# Patient Record
Sex: Female | Born: 1980 | Race: White | Hispanic: No | Smoking: Never smoker
Health system: Southern US, Community
[De-identification: ages and names within clinical notes are randomized; demographics above are authoritative.]

## PROBLEM LIST (undated history)

## (undated) DIAGNOSIS — C801 Malignant (primary) neoplasm, unspecified: Secondary | ICD-10-CM

## (undated) DIAGNOSIS — F411 Generalized anxiety disorder: Secondary | ICD-10-CM

## (undated) DIAGNOSIS — J45909 Unspecified asthma, uncomplicated: Secondary | ICD-10-CM

## (undated) DIAGNOSIS — E039 Hypothyroidism, unspecified: Secondary | ICD-10-CM

## (undated) HISTORY — PX: TUBAL LIGATION: SHX77

## (undated) HISTORY — DX: Generalized anxiety disorder: F41.1

## (undated) HISTORY — DX: Hypothyroidism, unspecified: E03.9

## (undated) HISTORY — PX: DILATION AND CURETTAGE OF UTERUS: SHX78

---

## 2020-05-12 LAB — LIPID PANEL
Cholesterol: 132 (ref 0–200)
HDL: 44 (ref 35–70)
LDL Cholesterol: 74
Triglycerides: 69 (ref 40–160)

## 2020-05-12 LAB — TSH: TSH: 7.43 — AB (ref 0.41–5.90)

## 2020-05-12 LAB — BASIC METABOLIC PANEL
BUN: 13 (ref 4–21)
Creatinine: 0.9 (ref 0.5–1.1)

## 2020-05-12 LAB — HEMOGLOBIN A1C: Hemoglobin A1C: 5.1

## 2020-06-07 ENCOUNTER — Other Ambulatory Visit: Payer: Self-pay

## 2020-06-07 ENCOUNTER — Ambulatory Visit: Payer: Medicaid Other | Admitting: "Endocrinology

## 2020-06-07 ENCOUNTER — Encounter: Payer: Self-pay | Admitting: "Endocrinology

## 2020-06-07 VITALS — BP 100/72 | HR 76 | Ht 66.0 in | Wt 141.6 lb

## 2020-06-07 DIAGNOSIS — E042 Nontoxic multinodular goiter: Secondary | ICD-10-CM | POA: Diagnosis not present

## 2020-06-07 DIAGNOSIS — E038 Other specified hypothyroidism: Secondary | ICD-10-CM

## 2020-06-07 DIAGNOSIS — E039 Hypothyroidism, unspecified: Secondary | ICD-10-CM | POA: Insufficient documentation

## 2020-06-07 NOTE — Progress Notes (Signed)
Endocrinology Consult Note                                            06/07/2020, 3:20 PM   Subjective:    Patient ID: Rebecca York, female    DOB: November 21, 1980, PCP Kotturi, Tyler Deis, MD   History reviewed. No pertinent past medical history. Past Surgical History:  Procedure Laterality Date  . TUBAL LIGATION     Social History   Socioeconomic History  . Marital status: Divorced    Spouse name: Not on file  . Number of children: Not on file  . Years of education: Not on file  . Highest education level: Not on file  Occupational History  . Not on file  Tobacco Use  . Smoking status: Never Smoker  . Smokeless tobacco: Not on file  Vaping Use  . Vaping Use: Never used  Substance and Sexual Activity  . Alcohol use: Never  . Drug use: Never  . Sexual activity: Not on file  Other Topics Concern  . Not on file  Social History Narrative  . Not on file   Social Determinants of Health   Financial Resource Strain: Not on file  Food Insecurity: Not on file  Transportation Needs: Not on file  Physical Activity: Not on file  Stress: Not on file  Social Connections: Not on file   Family History  Problem Relation Age of Onset  . Thyroid disease Mother   . Cancer Father    Outpatient Encounter Medications as of 06/07/2020  Medication Sig  . polyethylene glycol powder (GLYCOLAX/MIRALAX) 17 GM/SCOOP powder Take by mouth as needed.  . busPIRone (BUSPAR) 7.5 MG tablet Take 7.5 mg by mouth 3 (three) times daily.  Marland Kitchen dicyclomine (BENTYL) 20 MG tablet Take 20 mg by mouth 2 (two) times daily as needed.   No facility-administered encounter medications on file as of 06/07/2020.   ALLERGIES: No Known Allergies  VACCINATION STATUS:  There is no immunization history on file for this patient.  HPI Rebecca York is 40 y.o. female who presents today with a medical history as above. she is being seen in consultation for multinodular goiter requested by Wilburt Finlay, MD. History  is obtained directly from the patient.  Patient reports that she did have some neck pain last month where she went to ER in [place].  She was offered baseline thyroid ultrasound which revealed multinodular goiter.   She denies dysphagia, shortness of breath, nor voice change.  She does not have any family history of thyroid malignancy.  She has family history of various thyroid dysfunctions.   Her labs showed elevated TSH of 7.4 associated with normal free T4 1.1 consistent with subclinical hypothyroidism.  She is not on any thyroid hormone supplement or antithyroid intervention. -She reports fluctuating body weight, recent loss of 4 pounds.  She is establishing OB/GYN care in the area. She denies palpitations, tremors.   Review of Systems  Constitutional: + Recent loss of 4 pounds,  no fatigue, no subjective hyperthermia, no subjective hypothermia  Eyes: no blurry vision, no xerophthalmia ENT: no sore throat, no nodules palpated in throat, no dysphagia/odynophagia, no hoarseness Cardiovascular: no Chest Pain, no Shortness of Breath, no palpitations, no leg swelling Respiratory: no cough, no shortness of breath Gastrointestinal: no Nausea/Vomiting/Diarhhea Musculoskeletal: no muscle/joint aches Skin: no rashes Neurological: no tremors, no  numbness, no tingling, no dizziness Psychiatric: no depression, no anxiety  Objective:    Vitals with BMI 06/07/2020  Height 5\' 6"   Weight 141 lbs 10 oz  BMI 35.46  Systolic 568  Diastolic 72  Pulse 76    BP 100/72   Pulse 76   Ht 5\' 6"  (1.676 m)   Wt 141 lb 9.6 oz (64.2 kg)   BMI 22.85 kg/m   Wt Readings from Last 3 Encounters:  06/07/20 141 lb 9.6 oz (64.2 kg)    Physical Exam  Constitutional:  Body mass index is 22.85 kg/m.,  not in acute distress, normal state of mind Eyes: PERRLA, EOMI, no exophthalmos ENT: moist mucous membranes, + palpable nodular thyroid  enlarged.   Cardiovascular: normal precordial activity, Regular Rate and  Rhythm, no Murmur/Rubs/Gallops Respiratory:  adequate breathing efforts, no gross chest deformity, Clear to auscultation bilaterally Gastrointestinal: abdomen soft, Non -tender, No distension, Bowel Sounds present, no gross organomegaly Musculoskeletal: no gross deformities, strength intact in all four extremities Skin: moist, warm, no rashes Neurological: no tremor with outstretched hands, Deep tendon reflexes normal in bilateral lower extremities.  CMP ( most recent) CMP     Component Value Date/Time   BUN 13 05/12/2020 0000   CREATININE 0.9 05/12/2020 0000     Diabetic Labs (most recent): Lab Results  Component Value Date   HGBA1C 5.1 05/12/2020     Lipid Panel ( most recent) Lipid Panel     Component Value Date/Time   CHOL 132 05/12/2020 0000   TRIG 69 05/12/2020 0000   HDL 44 05/12/2020 0000   LDLCALC 74 05/12/2020 0000      Lab Results  Component Value Date   TSH 7.43 (A) 05/12/2020    June 02, 2020 thyroid ultrasound: Right lobe 5.2 cm with 2 nodules measuring 1 cm each.  Left lobe measured 4.7 cm with 1 nodule measuring 1.5 cm.  The left-sided nodule is felt to be suspicious and needs fine-needle aspiration biopsy.      Assessment & Plan:   1. Multinodular goiter   2- subclinical hypothyroidism  - Rebecca York  is being seen at a kind request of Kotturi, Tyler Deis, MD. - I have reviewed her available thyroid records and clinically evaluated the patient. - Based on these reviews, she has subclinical hypothyroidism with elevated TSH and multinodular goiter with 1.5 cm suspicious nodule in the left lobe. -  there is not sufficient information to proceed with definitive treatment plan. -She will need fine-needle aspiration biopsy of the left lobe nodule.  This procedure will be performed in the same facility where she had her thyroid ultrasound at [facility]. She will return with her biopsy results in 2 to 3 weeks. -Further action will depend her biopsy  results.  Regarding her subclinical hypothyroidism, she will not need intervention at this time.  If her biopsy results are negative, she will need repeat thyroid function test in 3 months. She has unexplained fluctuating body weight more recently loss of 4 pounds.  She is advised to update her screening studies including Pap smear.  She is scheduled to see her OB/GYN provider in the near future.      - I did not initiate any new prescriptions today. - she is advised to maintain close follow up with Kotturi, Tyler Deis, MD for primary care needs.   - Time spent with the patient: 60 minutes, of which >50% was spent in  counseling her about her multinodular goiter, subclinical hypothyroidism  and the rest in obtaining information about her symptoms, reviewing her previous labs/studies ( including abstractions from other facilities),  evaluations, and treatments,  and developing a plan to confirm diagnosis and long term treatment based on the latest standards of care/guidelines; and documenting her care.  Celsey Flanagan participated in the discussions, expressed understanding, and voiced agreement with the above plans.  All questions were answered to her satisfaction. she is encouraged to contact clinic should she have any questions or concerns prior to her return visit.  Follow up plan: Return in about 3 weeks (around 06/28/2020) for F/U with Biopsy Results.   Glade Lloyd, MD [facility] [address] [place], [place] [address] Phone: [phone]  Fax: [phone]     06/07/2020, 3:20 PM  This note was partially dictated with voice recognition software. Similar sounding words can be transcribed inadequately or may not  be corrected upon review.

## 2020-06-21 ENCOUNTER — Ambulatory Visit (HOSPITAL_COMMUNITY)
Admission: RE | Admit: 2020-06-21 | Discharge: 2020-06-21 | Disposition: A | Discharge status: Home / Self Care | Payer: Medicaid Other | Source: Ambulatory Visit | Attending: "Endocrinology | Admitting: "Endocrinology

## 2020-06-21 ENCOUNTER — Encounter (HOSPITAL_COMMUNITY): Payer: Self-pay

## 2020-06-21 ENCOUNTER — Other Ambulatory Visit: Payer: Self-pay

## 2020-06-21 DIAGNOSIS — D440 Neoplasm of uncertain behavior of thyroid gland: Secondary | ICD-10-CM | POA: Insufficient documentation

## 2020-06-21 DIAGNOSIS — E042 Nontoxic multinodular goiter: Secondary | ICD-10-CM

## 2020-06-21 DIAGNOSIS — E041 Nontoxic single thyroid nodule: Secondary | ICD-10-CM | POA: Diagnosis present

## 2020-06-21 MED ORDER — LIDOCAINE HCL (PF) 2 % IJ SOLN
10.0000 mL | Freq: Once | INTRAMUSCULAR | Status: AC
  Administered 2020-06-21: 10 mL

## 2020-06-21 NOTE — Sedation Documentation (Signed)
PT tolerated thyroid biopsy procedure well today. Labs obtained and sent for pathology. PT ambulatory at discharge with no acute distress noted and verbalized understanding of discharge instructions.

## 2020-06-21 NOTE — Discharge Instructions (Signed)
Post Operative Instructions for Thyroid Biopsy  1. Keep pressure bandage over site of biopsy for 3-4 hours after leaving office.  2. Normal activity is permitted as long as it does not involve anything strenuous (i.e., jogging, heavy lifting, etc.).  3. Some minor pain may be experienced when the local anesthesia wears off, and should be relieved with Tylenol, Advil, or ibuprofen.  4. You may experience some bruising and/or swelling at the site of the biopsy.  5. If you should develop severe pain, difficulty swallowing or difficulty breathing, please call the office or [facility] at [phone].  6. Apply ice pack for 20 minutes this evening.      

## 2020-06-22 LAB — CYTOLOGY - NON PAP

## 2020-06-23 ENCOUNTER — Encounter: Payer: Self-pay | Admitting: "Endocrinology

## 2020-06-23 ENCOUNTER — Other Ambulatory Visit: Payer: Self-pay

## 2020-06-23 ENCOUNTER — Ambulatory Visit (INDEPENDENT_AMBULATORY_CARE_PROVIDER_SITE_OTHER): Payer: Medicaid Other | Admitting: "Endocrinology

## 2020-06-23 VITALS — BP 96/70 | HR 60 | Ht 66.0 in | Wt 142.2 lb

## 2020-06-23 DIAGNOSIS — E042 Nontoxic multinodular goiter: Secondary | ICD-10-CM | POA: Diagnosis not present

## 2020-06-23 DIAGNOSIS — C73 Malignant neoplasm of thyroid gland: Secondary | ICD-10-CM

## 2020-06-23 DIAGNOSIS — R899 Unspecified abnormal finding in specimens from other organs, systems and tissues: Secondary | ICD-10-CM | POA: Insufficient documentation

## 2020-06-23 NOTE — Progress Notes (Signed)
06/23/2020, 5:10 PM  Endocrinology follow-up note   Subjective:    Patient ID: Rebecca York, female    DOB: [birth year], PCP Kotturi, Tyler Deis, MD   History reviewed. No pertinent past medical history. Past Surgical History:  Procedure Laterality Date  . TUBAL LIGATION     Social History   Socioeconomic History  . Marital status: Divorced    Spouse name: Not on file  . Number of children: Not on file  . Years of education: Not on file  . Highest education level: Not on file  Occupational History  . Not on file  Tobacco Use  . Smoking status: Never Smoker  . Smokeless tobacco: Never Used  Vaping Use  . Vaping Use: Never used  Substance and Sexual Activity  . Alcohol use: Never  . Drug use: Never  . Sexual activity: Not on file  Other Topics Concern  . Not on file  Social History Narrative  . Not on file   Social Determinants of Health   Financial Resource Strain: Not on file  Food Insecurity: Not on file  Transportation Needs: Not on file  Physical Activity: Not on file  Stress: Not on file  Social Connections: Not on file   Family History  Problem Relation Age of Onset  . Thyroid disease Mother   . Cancer Father    Outpatient Encounter Medications as of 06/23/2020  Medication Sig  . busPIRone (BUSPAR) 7.5 MG tablet Take 7.5 mg by mouth 3 (three) times daily.  Marland Kitchen dicyclomine (BENTYL) 20 MG tablet Take 20 mg by mouth 2 (two) times daily as needed.  . polyethylene glycol powder (GLYCOLAX/MIRALAX) 17 GM/SCOOP powder Take by mouth as needed.   No facility-administered encounter medications on file as of 06/23/2020.   ALLERGIES: No Known Allergies  VACCINATION STATUS:  There is no immunization history on file for this patient.  HPI Rebecca York is 40 y.o. female who returns to follow-up after recent fine-needle aspiration biopsy of a thyroid nodule.   She was referred for consult on multinodular goiter requested  by by her PMD: Kotturi, Tyler Deis, MD. History is obtained directly from the patient.  Patient reports that she did have some neck pain last month where she went to ER in [place].  She was offered baseline thyroid ultrasound which revealed multinodular goiter.   She denies dysphagia, shortness of breath, nor voice change.  She does not have any family history of thyroid malignancy.  She has family history of various thyroid dysfunctions.   Her labs showed elevated TSH of 7.4 associated with normal free T4 1.1 consistent with subclinical hypothyroidism.  She is not on any thyroid hormone supplement or antithyroid intervention.  Her fine-needle aspiration biopsy on Jun 21, 2020 revealed category 5 cytology from left lobe nodule- suspicious for malignancy. -She reports fluctuating body weight, recent loss of 4 pounds.  She is establishing OB/GYN care in the area. She denies palpitations, tremors.   Review of Systems  Constitutional: + Minimally fluctuating body weight,   no fatigue, no subjective hyperthermia, no subjective hypothermia    Objective:    Vitals with BMI 06/23/2020 06/21/2020 06/07/2020  Height 5\' 6"  - 5\' 6"   Weight 142 lbs 3 oz - 141 lbs  10 oz  BMI 35.32 - 99.24  Systolic 96 268 341  Diastolic 70 62 72  Pulse 60 76 76    BP 96/70   Pulse 60   Ht 5\' 6"  (1.676 m)   Wt 142 lb 3.2 oz (64.5 kg)   BMI 22.95 kg/m   Wt Readings from Last 3 Encounters:  06/23/20 142 lb 3.2 oz (64.5 kg)  06/07/20 141 lb 9.6 oz (64.2 kg)    Physical Exam  Constitutional:  Body mass index is 22.95 kg/m.,  not in acute distress, normal state of mind Eyes: PERRLA, EOMI, no exophthalmos ENT: moist mucous membranes, + palpable nodular thyroid  enlarged.     CMP ( most recent) CMP     Component Value Date/Time   BUN 13 05/12/2020 0000   CREATININE 0.9 05/12/2020 0000     Diabetic Labs (most recent): Lab Results  Component Value Date   HGBA1C 5.1 05/12/2020     Lipid Panel ( most  recent) Lipid Panel     Component Value Date/Time   CHOL 132 05/12/2020 0000   TRIG 69 05/12/2020 0000   HDL 44 05/12/2020 0000   LDLCALC 74 05/12/2020 0000      Lab Results  Component Value Date   TSH 7.43 (A) 05/12/2020    June 02, 2020 thyroid ultrasound: Right lobe 5.2 cm with 2 nodules measuring 1 cm each.  Left lobe measured 4.7 cm with 1 nodule measuring 1.5 cm.  The left-sided nodule is felt to be suspicious and needs fine-needle aspiration biopsy.      Clinical History: 1.5 cm LUL  Specimen Submitted: A. THYROID GLAND, LEFT LOBE, FINE NEEDLE  ASPIRATION:  FINAL MICROSCOPIC DIAGNOSIS:  - Suspicious for malignancy (Bethesda category V)   SPECIMEN ADEQUACY:  Satisfactory for evaluation   DIAGNOSTIC COMMENTS:  Suspicious for papillary carcinoma  Afirma testing is available upon clinician request.   Assessment & Plan:   1.  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  This is from the 1.5 cm suspicious looking nodule in the left lobe of her thyroid.  She has nodules on the right lobe as well.  Based on category 5 cytology, which showed frank malignancy, she did not need molecular study. Options of approach discussed with her.  The best option would be total thyroidectomy by experienced thyroid surgeon. I would refer her to Dr. Armandina Gemma in [place]. She will return to clinic after her surgery with new set of thyroid function test. She is made aware of the fact that she would be on thyroid hormone for life subsequent to her surgery. Based on the findings of her surgical pathology, she may be considered for radioactive iodine thyroid remnant ablation. -Elevated TSH of 7.43 would have required thyroid hormone supplement, however we will delay this intervention until after her surgery. She has  unexplained fluctuating body weight more recently loss of 4 pounds.  She is advised to update her screening studies including Pap smear.  She is scheduled to see her OB/GYN provider in the near future.    - I did not initiate any new prescriptions today. - she is advised to maintain close follow up with Kotturi, Tyler Deis, MD for primary care needs.   I spent 31 minutes in the care of the patient today including review of   labs from Thyroid Function, imaging/biopsy results,  and face-to-face time discussing with the patient on plan of intervention based on the latest standards of care / guidelines;   and documenting the encounter.  Rebecca York  participated in the discussions, expressed understanding, and voiced agreement with the above plans.  All questions were answered to her satisfaction. she is encouraged to contact clinic should she have any questions or concerns prior to her return visit.   Follow up plan: Return in about 5 weeks (around 07/28/2020) for F/U with Labs after Surgery.   Glade Lloyd, MD [facility] [address] [place], [place] [address] Phone: [phone]  Fax: [phone]     06/23/2020, 5:10 PM  This note was partially dictated with voice recognition software. Similar sounding words can be transcribed inadequately or may not  be corrected upon review.

## 2020-06-29 ENCOUNTER — Ambulatory Visit: Payer: Medicaid Other | Admitting: "Endocrinology

## 2020-07-11 ENCOUNTER — Ambulatory Visit: Payer: Medicaid Other | Admitting: "Endocrinology

## 2020-07-25 ENCOUNTER — Ambulatory Visit: Payer: Medicaid Other | Admitting: "Endocrinology

## 2020-07-26 ENCOUNTER — Ambulatory Visit: Payer: Self-pay | Admitting: Surgery

## 2020-07-31 ENCOUNTER — Ambulatory Visit (HOSPITAL_COMMUNITY)
Admission: RE | Admit: 2020-07-31 | Discharge: 2020-07-31 | Disposition: A | Discharge status: Home / Self Care | Payer: Medicaid Other | Source: Ambulatory Visit | Attending: Anesthesiology | Admitting: Anesthesiology

## 2020-07-31 ENCOUNTER — Other Ambulatory Visit: Payer: Self-pay

## 2020-07-31 ENCOUNTER — Ambulatory Visit: Payer: Medicaid Other | Admitting: "Endocrinology

## 2020-07-31 ENCOUNTER — Encounter (HOSPITAL_COMMUNITY)
Admission: RE | Admit: 2020-07-31 | Discharge: 2020-07-31 | Disposition: A | Discharge status: Home / Self Care | Payer: Medicaid Other | Source: Ambulatory Visit | Attending: Surgery | Admitting: Surgery

## 2020-07-31 ENCOUNTER — Encounter (HOSPITAL_COMMUNITY): Payer: Self-pay

## 2020-07-31 DIAGNOSIS — D497 Neoplasm of unspecified behavior of endocrine glands and other parts of nervous system: Secondary | ICD-10-CM | POA: Insufficient documentation

## 2020-07-31 HISTORY — DX: Unspecified asthma, uncomplicated: J45.909

## 2020-07-31 LAB — CBC
HCT: 40 % (ref 36.0–46.0)
Hemoglobin: 13.3 g/dL (ref 12.0–15.0)
MCH: 27.9 pg (ref 26.0–34.0)
MCHC: 33.3 g/dL (ref 30.0–36.0)
MCV: 83.9 fL (ref 80.0–100.0)
Platelets: 194 10*3/uL (ref 150–400)
RBC: 4.77 MIL/uL (ref 3.87–5.11)
RDW: 12.9 % (ref 11.5–15.5)
WBC: 6.7 10*3/uL (ref 4.0–10.5)
nRBC: 0 % (ref 0.0–0.2)

## 2020-07-31 LAB — BASIC METABOLIC PANEL
Anion gap: 7 (ref 5–15)
BUN: 15 mg/dL (ref 6–20)
CO2: 25 mmol/L (ref 22–32)
Calcium: 9.1 mg/dL (ref 8.9–10.3)
Chloride: 107 mmol/L (ref 98–111)
Creatinine, Ser: 0.78 mg/dL (ref 0.44–1.00)
GFR, Estimated: >60 mL/min (ref >60)
Glucose, Bld: 93 mg/dL (ref 70–99)
Potassium: 4 mmol/L (ref 3.5–5.1)
Sodium: 139 mmol/L (ref 135–145)

## 2020-07-31 LAB — HCG, SERUM, QUALITATIVE: Preg, Serum: NEGATIVE

## 2020-07-31 NOTE — Progress Notes (Signed)
   PCP - Dr Maeola Sarah    Chest x-ray - epic 07/31/2020 EKG - epic 07/31/2020  B Activity level:  Can go up a flight of stairs and activities of daily living without stopping and without symptoms    Anesthesia review:   Patient denies shortness of breath, fever, cough and chest pain at PAT appointment   Patient verbalized understanding of instructions that were given to them at the PAT appointment. Patient was also instructed that they will need to review over the PAT instructions again at home before surgery.

## 2020-07-31 NOTE — Patient Instructions (Addendum)
DUE TO COVID-19 ONLY ONE VISITOR IS ALLOWED TO COME WITH YOU AND STAY IN THE WAITING ROOM ONLY DURING PRE OP AND PROCEDURE DAY OF SURGERY. THE 1 VISITOR  MAY VISIT WITH YOU AFTER SURGERY IN YOUR PRIVATE ROOM DURING VISITING HOURS ONLY!  YOU NEED TO HAVE A COVID 19 TEST ON Monday August 07, 2020 @ 2:30 pm, THIS TEST MUST BE DONE BEFORE SURGERY,  COVID TESTING SITE [place] [id number], IT IS ON THE RIGHT GOING OUT [address] APPROXIMATELY  2 MINUTES PAST [employer] ON THE RIGHT. ONCE YOUR COVID TEST IS COMPLETED,  PLEASE BEGIN THE QUARANTINE INSTRUCTIONS AS OUTLINED IN YOUR HANDOUT.                Rebecca York  07/31/2020   Your procedure is scheduled on: Thursday August 10, 2020   Report to [facility] Main  Entrance   Report to admitting at 0800 AM     Call this number if you have problems the morning of surgery [phone]    Remember: Do not eat food after Midnight; clear liquids from midnight till 0700 consuming entire ensure presurgery drink bu 0700 then nothing by mouth.    BRUSH YOUR TEETH MORNING OF SURGERY AND RINSE YOUR MOUTH OUT, NO CHEWING GUM CANDY OR MINTS.     Take these medicines the morning of surgery with A SIP OF WATER: NONE                                You may not have any metal on your body including hair pins and              piercings  Do not wear jewelry, make-up, lotions, powders or perfumes, deodorant             Do not wear nail polish on your fingernails.  Do not shave  48 hours prior to surgery.          Do not bring valuables to the hospital. [facility].  Contacts, dentures or bridgework may not be worn into surgery.  Leave suitcase in the car. After surgery it may be brought to your room.     Patients discharged the day of surgery will not be allowed to drive home. IF YOU ARE HAVING SURGERY AND GOING HOME THE SAME DAY, YOU MUST HAVE AN ADULT TO DRIVE YOU HOME  AND BE WITH YOU FOR 24 HOURS. YOU MAY GO HOME BY TAXI OR UBER OR ORTHERWISE, BUT AN ADULT MUST ACCOMPANY YOU HOME AND STAY WITH YOU FOR 24 HOURS.  _____________________________________________________________________                CLEAR LIQUID DIET   Foods Allowed                                                                     Foods Excluded  Coffee and tea, regular and decaf  liquids that you cannot  Plain Jell-O any favor except red or purple                                           see through such as: Fruit ices (not with fruit pulp)                                     milk, soups, orange juice  Iced Popsicles                                    All solid food Carbonated beverages, regular and diet                                    Cranberry, grape and apple juices Sports drinks like Gatorade Lightly seasoned clear broth or consume(fat free) Sugar, honey syrup  Sample Menu Breakfast                                Lunch                                     Supper Cranberry juice                    Beef broth                            Chicken broth Jell-O                                     Grape juice                           Apple juice Coffee or tea                        Jell-O                                      Popsicle                                                Coffee or tea                        Coffee or tea  _____________________________________________________________________  [facility] - Preparing for Surgery Before surgery, you can play an important role.  Because skin is not sterile, your skin needs to be as free of germs as possible.  You can reduce the number of germs on your skin by washing with CHG (chlorahexidine gluconate) soap before surgery.  CHG is an antiseptic cleaner which kills  germs and bonds with the skin to continue killing germs even after washing. Please DO NOT use if you have an allergy to CHG or  antibacterial soaps.  If your skin becomes reddened/irritated stop using the CHG and inform your nurse when you arrive at Short Stay. Do not shave (including legs and underarms) for at least 48 hours prior to the first CHG shower.  You may shave your face/neck. Please follow these instructions carefully:  1.  Shower with CHG Soap the night before surgery and the  morning of Surgery.  2.  If you choose to wash your hair, wash your hair first as usual with your  normal  shampoo.  3.  After you shampoo, rinse your hair and body thoroughly to remove the  shampoo.                           4.  Use CHG as you would any other liquid soap.  You can apply chg directly  to the skin and wash                       Gently with a scrungie or clean washcloth.  5.  Apply the CHG Soap to your body ONLY FROM THE NECK DOWN.   Do not use on face/ open                           Wound or open sores. Avoid contact with eyes, ears mouth and genitals (private parts).                       Wash face,  Genitals (private parts) with your normal soap.             6.  Wash thoroughly, paying special attention to the area where your surgery  will be performed.  7.  Thoroughly rinse your body with warm water from the neck down.  8.  DO NOT shower/wash with your normal soap after using and rinsing off  the CHG Soap.                9.  Pat yourself dry with a clean towel.            10.  Wear clean pajamas.            11.  Place clean sheets on your bed the night of your first shower and do not  sleep with pets. Day of Surgery : Do not apply any lotions/deodorants the morning of surgery.  Please wear clean clothes to the hospital/surgery center.  FAILURE TO FOLLOW THESE INSTRUCTIONS MAY RESULT IN THE CANCELLATION OF YOUR SURGERY PATIENT SIGNATURE_________________________________  NURSE SIGNATURE__________________________________  ________________________________________________________________________

## 2020-08-06 ENCOUNTER — Encounter (HOSPITAL_COMMUNITY): Payer: Self-pay | Admitting: Surgery

## 2020-08-06 DIAGNOSIS — D440 Neoplasm of uncertain behavior of thyroid gland: Secondary | ICD-10-CM | POA: Diagnosis present

## 2020-08-06 NOTE — H&P (Signed)
General Surgery [facility]  Rebecca York DOB: 1980/10/16 Divorced / Language: English / Race: White Female   History of Present Illness   The patient is a 40 year old female who presents with a thyroid nodule.  CHIEF COMPLAINT: thyroid neoplasm suspicious for papillary carcinoma  Patient is referred by Dr. 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  Patient had been noted on routine laboratory studies to have an elevated TSH level of 7.43.  She was evaluated by her primary care physician and an ultrasound was obtained.  The study was performed on June 02, 2020.  It demonstrated to nodules in the right thyroid lobe each measuring 1 cm in size.  There was a 1.5 cm in the superior left thyroid lobe which was suspicious and fine-needle aspiration biopsy was ordered.  Patient underwent biopsy on Jun 21, 2020.  Cytopathology was suspicious for malignancy, Bethesda category V, suspicious for papillary thyroid carcinoma.  Patient has no prior history of thyroid disease.  She has never been on thyroid medication.  Thyroid medication has not yet been started.  She has had no prior head or neck surgery.  There is a family history of thyroid nodules and thyroid goiter in both the patient's mother and her sister.  Patient has noted some voice quality changes recently.  She has also had some discomfort in the left side of the neck.  She works as an Scientist, physiological at an assisted living facility.   Past Surgical History  No pertinent past surgical history    Diagnostic Studies History  Colonoscopy   never Mammogram   never Pap Smear   1-5 years ago  Allergies  No Known Drug Allergies    Allergies Reconciled    Medication History  busPIRone HCl  (7.5MG  Tablet, Oral) Active. MiraLax  (17GM Packet, Oral) Active. Medications Reconciled   Social History  Alcohol use   Occasional alcohol  use. Caffeine use   Carbonated beverages, Coffee. No drug use   Tobacco use   Never smoker.  Family History  Alcohol Abuse   Father, Mother. Cancer   Family Members In General, Father. Cervical Cancer   Sister. Thyroid problems   Mother, Sister.  Pregnancy / Birth History  Age at menarche   10 years. Contraceptive History   Intrauterine device, Oral contraceptives. Gravida   6 Length (months) of breastfeeding   3-6 Maternal age   77-20 Para   4  Other Problems  Asthma    Review of Systems  General Present- Appetite Loss, Chills, Night Sweats and Weight Loss. Not Present- Fatigue, Fever and Weight Gain. Skin Not Present- Change in Wart/Mole, Dryness, Hives, Jaundice, New Lesions, Non-Healing Wounds, Rash and Ulcer. HEENT Present- Hearing Loss and Wears glasses/contact lenses. Not Present- Earache, Hoarseness, Nose Bleed, Oral Ulcers, Ringing in the Ears, Seasonal Allergies, Sinus Pain, Sore Throat, Visual Disturbances and Yellow Eyes. Respiratory Present- Difficulty Breathing. Not Present- Bloody sputum, Chronic Cough, Snoring and Wheezing. Breast Not Present- Breast Mass, Breast Pain, Nipple Discharge and Skin Changes. Cardiovascular Present- Shortness of Breath. Not Present- Chest Pain, Difficulty Breathing Lying Down, Leg Cramps, Palpitations, Rapid Heart Rate and Swelling of Extremities. Gastrointestinal Present- Bloating and Excessive gas. Not Present- Abdominal Pain, Bloody Stool, Change in Bowel Habits, Chronic diarrhea, Constipation, Difficulty Swallowing, Gets full quickly at meals, Hemorrhoids, Indigestion, Nausea, Rectal Pain and Vomiting. Female Genitourinary Not Present- Frequency, Nocturia, Painful Urination, Pelvic Pain and Urgency. Musculoskeletal Present-  Back Pain. Not Present- Joint Pain, Joint Stiffness, Muscle Pain, Muscle Weakness and Swelling of Extremities. Neurological Present- Headaches. Not Present- Decreased Memory, Fainting, Numbness, Seizures, Tingling,  Tremor, Trouble walking and Weakness. Psychiatric Present- Anxiety and Frequent crying. Not Present- Bipolar, Change in Sleep Pattern, Depression and Fearful. Hematology Not Present- Blood Thinners, Easy Bruising, Excessive bleeding, Gland problems, HIV and Persistent Infections.  Vitals  Weight: 141.38 lb   Height: 66 in  Body Surface Area: 1.73 m   Body Mass Index: 22.82 kg/m   Temp.: 98.1 F    Pulse: 84 (Regular)    P.OX: 98% (Room air) BP: 110/80(Sitting, Left Arm, Standard)  Physical Exam   GENERAL APPEARANCE Development: normal Nutritional status: normal Gross deformities: none  SKIN Rash, lesions, ulcers: none Induration, erythema: none Nodules: none palpable  EYES Conjunctiva and lids: normal Pupils: equal and reactive Iris: normal bilaterally  EARS, NOSE, MOUTH, THROAT External ears: no lesion or deformity External nose: no lesion or deformity Hearing: grossly normal Due to Covid-19 pandemic, patient is wearing a mask.  NECK Symmetric: yes Trachea: midline Thyroid: Right thyroid lobe is normal in size and slightly firm to palpation without discrete or dominant nodule. Left thyroid lobe is slightly tender to palpation with what appears to be a small relatively firm nodule in the superior pole. There is no associated lymphadenopathy. There are no supraclavicular masses.  CHEST Respiratory effort: normal Retraction or accessory muscle use: no Breath sounds: normal bilaterally Rales, rhonchi, wheeze: none  CARDIOVASCULAR Auscultation: regular rhythm, normal rate Murmurs: none Pulses: radial pulse 2+ palpable Lower extremity edema: none  MUSCULOSKELETAL Station and gait: normal Digits and nails: no clubbing or cyanosis Muscle strength: grossly normal all extremities Range of motion: grossly normal all extremities Deformity: none  LYMPHATIC Cervical: none palpable Supraclavicular: none palpable  PSYCHIATRIC Oriented to person, place, and time:  yes Mood and affect: normal for situation Judgment and insight: appropriate for situation    Assessment & Plan   NEOPLASM OF UNCERTAIN BEHAVIOR OF THYROID GLAND (D44.0) MULTIPLE THYROID NODULES (E04.2)  Patient is referred by her endocrinologist for surgical evaluation and management of a newly diagnosed thyroid nodule which is suspicious for papillary thyroid carcinoma.   Patient provided with a copy of "The Thyroid Book: Medical and Surgical Treatment of Thyroid Problems", published by [employer], 16 pages.  Book reviewed and explained to patient during visit today.  Patient has bilateral thyroid nodules.  The dominant nodule in the left superior pole is suspicious on fine-needle aspiration cytology for papillary thyroid carcinoma.  Today we discussed the options for surgical management.  We discussed performing a left thyroid lobectomy versus performing a total thyroidectomy.  We discussed the risk and benefits of surgery including the risk of recurrent laryngeal nerve injury and injury to parathyroid glands.  We discussed the fact that she would need to begin taking thyroid hormone and we continue taking that for the rest of her life.  We discussed the size and location of the surgical incision.  We discussed the hospital stay to be anticipated.  We discussed the potential need for radioactive iodine treatment.  The patient understands and wishes to proceed with surgery in the near future.  The risks and benefits of the procedure have been discussed at length with the patient.  The patient understands the proposed procedure, potential alternative treatments, and the course of recovery to be expected.  All of the patient's questions have been answered at this time.  The patient wishes to proceed with  surgery.  Armandina Gemma, MD [facility] Office: [phone]

## 2020-08-07 ENCOUNTER — Other Ambulatory Visit (HOSPITAL_COMMUNITY)
Admission: RE | Admit: 2020-08-07 | Discharge: 2020-08-07 | Disposition: A | Discharge status: Home / Self Care | Payer: Medicaid Other | Source: Ambulatory Visit | Attending: Surgery | Admitting: Surgery

## 2020-08-08 ENCOUNTER — Other Ambulatory Visit (HOSPITAL_COMMUNITY)
Admission: RE | Admit: 2020-08-08 | Discharge: 2020-08-08 | Disposition: A | Discharge status: Home / Self Care | Payer: Medicaid Other | Source: Ambulatory Visit | Attending: Surgery | Admitting: Surgery

## 2020-08-08 DIAGNOSIS — Z20822 Contact with and (suspected) exposure to covid-19: Secondary | ICD-10-CM | POA: Insufficient documentation

## 2020-08-08 DIAGNOSIS — Z01812 Encounter for preprocedural laboratory examination: Secondary | ICD-10-CM | POA: Diagnosis not present

## 2020-08-08 LAB — SARS CORONAVIRUS 2 (TAT 6-24 HRS): SARS Coronavirus 2: NEGATIVE

## 2020-08-10 ENCOUNTER — Ambulatory Visit (HOSPITAL_COMMUNITY): Payer: Medicaid Other | Admitting: Anesthesiology

## 2020-08-10 ENCOUNTER — Encounter (HOSPITAL_COMMUNITY)
Admission: RE | Disposition: A | Discharge status: Home / Self Care | Payer: Self-pay | Source: Other Acute Inpatient Hospital | Attending: Surgery

## 2020-08-10 ENCOUNTER — Encounter (HOSPITAL_COMMUNITY): Payer: Self-pay | Admitting: Surgery

## 2020-08-10 ENCOUNTER — Ambulatory Visit (HOSPITAL_COMMUNITY)
Admission: RE | Admit: 2020-08-10 | Discharge: 2020-08-11 | Disposition: A | Discharge status: Home / Self Care | Payer: Medicaid Other | Source: Other Acute Inpatient Hospital | Attending: Surgery | Admitting: Surgery

## 2020-08-10 ENCOUNTER — Other Ambulatory Visit: Payer: Self-pay

## 2020-08-10 DIAGNOSIS — D440 Neoplasm of uncertain behavior of thyroid gland: Secondary | ICD-10-CM | POA: Diagnosis present

## 2020-08-10 DIAGNOSIS — E063 Autoimmune thyroiditis: Secondary | ICD-10-CM | POA: Diagnosis not present

## 2020-08-10 DIAGNOSIS — C73 Malignant neoplasm of thyroid gland: Secondary | ICD-10-CM | POA: Insufficient documentation

## 2020-08-10 DIAGNOSIS — Z8349 Family history of other endocrine, nutritional and metabolic diseases: Secondary | ICD-10-CM | POA: Diagnosis not present

## 2020-08-10 DIAGNOSIS — D497 Neoplasm of unspecified behavior of endocrine glands and other parts of nervous system: Secondary | ICD-10-CM

## 2020-08-10 DIAGNOSIS — Z79899 Other long term (current) drug therapy: Secondary | ICD-10-CM | POA: Insufficient documentation

## 2020-08-10 DIAGNOSIS — E042 Nontoxic multinodular goiter: Secondary | ICD-10-CM | POA: Diagnosis present

## 2020-08-10 HISTORY — PX: THYROIDECTOMY: SHX17

## 2020-08-10 SURGERY — THYROIDECTOMY
Anesthesia: General | Site: Neck

## 2020-08-10 MED ORDER — ENSURE ENLIVE PO LIQD
237.0000 mL | Freq: Two times a day (BID) | ORAL | Status: DC
  Administered 2020-08-10: 237 mL via ORAL

## 2020-08-10 MED ORDER — LIDOCAINE 2% (20 MG/ML) 5 ML SYRINGE
INTRAMUSCULAR | Status: AC
  Filled 2020-08-10: qty 5

## 2020-08-10 MED ORDER — HYDROMORPHONE HCL 1 MG/ML IJ SOLN
INTRAMUSCULAR | Status: AC
  Administered 2020-08-10: 0.5 mg via INTRAVENOUS
  Filled 2020-08-10: qty 1

## 2020-08-10 MED ORDER — PROPOFOL 10 MG/ML IV BOLUS
INTRAVENOUS | Status: DC | PRN
  Administered 2020-08-10: 130 mg via INTRAVENOUS

## 2020-08-10 MED ORDER — ROCURONIUM BROMIDE 10 MG/ML (PF) SYRINGE
PREFILLED_SYRINGE | INTRAVENOUS | Status: AC
  Filled 2020-08-10: qty 10

## 2020-08-10 MED ORDER — 0.9 % SODIUM CHLORIDE (POUR BTL) OPTIME
TOPICAL | Status: DC | PRN
  Administered 2020-08-10: 1000 mL

## 2020-08-10 MED ORDER — ONDANSETRON HCL 4 MG/2ML IJ SOLN
INTRAMUSCULAR | Status: DC | PRN
  Administered 2020-08-10: 4 mg via INTRAVENOUS

## 2020-08-10 MED ORDER — OXYCODONE HCL 5 MG PO TABS: 5.0000 mg | ORAL_TABLET | ORAL | Status: DC | PRN

## 2020-08-10 MED ORDER — HEMOSTATIC AGENTS (NO CHARGE) OPTIME
TOPICAL | Status: DC | PRN
  Administered 2020-08-10: 1 via TOPICAL

## 2020-08-10 MED ORDER — EPHEDRINE SULFATE-NACL 50-0.9 MG/10ML-% IV SOSY
PREFILLED_SYRINGE | INTRAVENOUS | Status: DC | PRN
  Administered 2020-08-10: 10 mg via INTRAVENOUS

## 2020-08-10 MED ORDER — ORAL CARE MOUTH RINSE: 15.0000 mL | Freq: Once | OROMUCOSAL | Status: AC

## 2020-08-10 MED ORDER — DEXAMETHASONE SODIUM PHOSPHATE 10 MG/ML IJ SOLN
INTRAMUSCULAR | Status: DC | PRN
  Administered 2020-08-10: 10 mg via INTRAVENOUS

## 2020-08-10 MED ORDER — PHENYLEPHRINE 40 MCG/ML (10ML) SYRINGE FOR IV PUSH (FOR BLOOD PRESSURE SUPPORT)
PREFILLED_SYRINGE | INTRAVENOUS | Status: AC
  Filled 2020-08-10: qty 10

## 2020-08-10 MED ORDER — CALCIUM CARBONATE 1250 (500 CA) MG PO TABS
2.0000 | ORAL_TABLET | Freq: Three times a day (TID) | ORAL | Status: DC
  Administered 2020-08-10 – 2020-08-11 (×2): 1000 mg via ORAL
  Filled 2020-08-10 (×2): qty 1

## 2020-08-10 MED ORDER — PHENYLEPHRINE 40 MCG/ML (10ML) SYRINGE FOR IV PUSH (FOR BLOOD PRESSURE SUPPORT)
PREFILLED_SYRINGE | INTRAVENOUS | Status: DC | PRN
  Administered 2020-08-10 (×2): 80 ug via INTRAVENOUS
  Administered 2020-08-10: 120 ug via INTRAVENOUS
  Administered 2020-08-10: 80 ug via INTRAVENOUS

## 2020-08-10 MED ORDER — CEFAZOLIN SODIUM-DEXTROSE 2-4 GM/100ML-% IV SOLN
2.0000 g | INTRAVENOUS | Status: AC
  Administered 2020-08-10: 2 g via INTRAVENOUS
  Filled 2020-08-10: qty 100

## 2020-08-10 MED ORDER — SUGAMMADEX SODIUM 200 MG/2ML IV SOLN
INTRAVENOUS | Status: DC | PRN
  Administered 2020-08-10: 130 mg via INTRAVENOUS

## 2020-08-10 MED ORDER — CHLORHEXIDINE GLUCONATE CLOTH 2 % EX PADS: 6.0000 | MEDICATED_PAD | Freq: Once | CUTANEOUS | Status: DC

## 2020-08-10 MED ORDER — ROCURONIUM BROMIDE 10 MG/ML (PF) SYRINGE
PREFILLED_SYRINGE | INTRAVENOUS | Status: DC | PRN
  Administered 2020-08-10: 10 mg via INTRAVENOUS
  Administered 2020-08-10: 50 mg via INTRAVENOUS

## 2020-08-10 MED ORDER — LACTATED RINGERS IV SOLN: INTRAVENOUS | Status: DC

## 2020-08-10 MED ORDER — HYDROMORPHONE HCL 1 MG/ML IJ SOLN
INTRAMUSCULAR | Status: AC
  Filled 2020-08-10: qty 1

## 2020-08-10 MED ORDER — ACETAMINOPHEN 325 MG PO TABS
650.0000 mg | ORAL_TABLET | Freq: Four times a day (QID) | ORAL | Status: DC | PRN
  Administered 2020-08-10: 650 mg via ORAL
  Filled 2020-08-10: qty 2

## 2020-08-10 MED ORDER — LIDOCAINE 2% (20 MG/ML) 5 ML SYRINGE
INTRAMUSCULAR | Status: DC | PRN
  Administered 2020-08-10: 60 mg via INTRAVENOUS

## 2020-08-10 MED ORDER — ACETAMINOPHEN 650 MG RE SUPP: 650.0000 mg | Freq: Four times a day (QID) | RECTAL | Status: DC | PRN

## 2020-08-10 MED ORDER — MIDAZOLAM HCL 2 MG/2ML IJ SOLN
INTRAMUSCULAR | Status: DC | PRN
  Administered 2020-08-10: 2 mg via INTRAVENOUS

## 2020-08-10 MED ORDER — ONDANSETRON 4 MG PO TBDP: 4.0000 mg | ORAL_TABLET | Freq: Four times a day (QID) | ORAL | Status: DC | PRN

## 2020-08-10 MED ORDER — HYDROMORPHONE HCL 1 MG/ML IJ SOLN
0.2500 mg | INTRAMUSCULAR | Status: DC | PRN
  Administered 2020-08-10 (×2): 0.5 mg via INTRAVENOUS

## 2020-08-10 MED ORDER — TRAMADOL HCL 50 MG PO TABS: 50.0000 mg | ORAL_TABLET | Freq: Four times a day (QID) | ORAL | Status: DC | PRN

## 2020-08-10 MED ORDER — ACETAMINOPHEN 500 MG PO TABS
1000.0000 mg | ORAL_TABLET | Freq: Once | ORAL | Status: AC
  Administered 2020-08-10: 1000 mg via ORAL
  Filled 2020-08-10: qty 2

## 2020-08-10 MED ORDER — FENTANYL CITRATE (PF) 250 MCG/5ML IJ SOLN
INTRAMUSCULAR | Status: AC
  Filled 2020-08-10: qty 5

## 2020-08-10 MED ORDER — SODIUM CHLORIDE 0.45 % IV SOLN: INTRAVENOUS | Status: DC

## 2020-08-10 MED ORDER — FENTANYL CITRATE (PF) 250 MCG/5ML IJ SOLN
INTRAMUSCULAR | Status: DC | PRN
  Administered 2020-08-10: 100 ug via INTRAVENOUS
  Administered 2020-08-10 (×3): 50 ug via INTRAVENOUS

## 2020-08-10 MED ORDER — AMISULPRIDE (ANTIEMETIC) 5 MG/2ML IV SOLN
5.0000 mg | Freq: Once | INTRAVENOUS | Status: AC | PRN
  Administered 2020-08-10: 5 mg via INTRAVENOUS

## 2020-08-10 MED ORDER — AMISULPRIDE (ANTIEMETIC) 5 MG/2ML IV SOLN
INTRAVENOUS | Status: AC
  Filled 2020-08-10: qty 2

## 2020-08-10 MED ORDER — PROPOFOL 10 MG/ML IV BOLUS
INTRAVENOUS | Status: AC
  Filled 2020-08-10: qty 20

## 2020-08-10 MED ORDER — DEXAMETHASONE SODIUM PHOSPHATE 10 MG/ML IJ SOLN
INTRAMUSCULAR | Status: AC
  Filled 2020-08-10: qty 1

## 2020-08-10 MED ORDER — CHLORHEXIDINE GLUCONATE 0.12 % MT SOLN
15.0000 mL | Freq: Once | OROMUCOSAL | Status: AC
  Administered 2020-08-10: 15 mL via OROMUCOSAL

## 2020-08-10 MED ORDER — MIDAZOLAM HCL 2 MG/2ML IJ SOLN
INTRAMUSCULAR | Status: AC
  Filled 2020-08-10: qty 2

## 2020-08-10 MED ORDER — ONDANSETRON HCL 4 MG/2ML IJ SOLN
4.0000 mg | Freq: Four times a day (QID) | INTRAMUSCULAR | Status: DC | PRN
  Administered 2020-08-10: 4 mg via INTRAVENOUS
  Filled 2020-08-10: qty 2

## 2020-08-10 MED ORDER — HYDROMORPHONE HCL 1 MG/ML IJ SOLN
1.0000 mg | INTRAMUSCULAR | Status: DC | PRN
  Administered 2020-08-10: 1 mg via INTRAVENOUS
  Filled 2020-08-10: qty 1

## 2020-08-10 MED ORDER — ONDANSETRON HCL 4 MG/2ML IJ SOLN
INTRAMUSCULAR | Status: AC
  Filled 2020-08-10: qty 2

## 2020-08-10 SURGICAL SUPPLY — 31 items
ATTRACTOMAT 16X20 MAGNETIC DRP (DRAPES) ×3 IMPLANT
BAG COUNTER SPONGE SURGICOUNT (BAG) IMPLANT
BAG SURGICOUNT SPONGE COUNTING (BAG)
BLADE SURG 15 STRL LF DISP TIS (BLADE) ×1 IMPLANT
BLADE SURG 15 STRL SS (BLADE) ×2
CHLORAPREP W/TINT 26 (MISCELLANEOUS) ×3 IMPLANT
CLIP VESOCCLUDE MED 6/CT (CLIP) ×6 IMPLANT
CLIP VESOCCLUDE SM WIDE 6/CT (CLIP) ×12 IMPLANT
COVER SURGICAL LIGHT HANDLE (MISCELLANEOUS) ×3 IMPLANT
DERMABOND ADVANCED (GAUZE/BANDAGES/DRESSINGS) ×2
DERMABOND ADVANCED .7 DNX12 (GAUZE/BANDAGES/DRESSINGS) ×1 IMPLANT
DRAPE LAPAROTOMY T 98X78 PEDS (DRAPES) ×3 IMPLANT
DRAPE UTILITY XL STRL (DRAPES) ×3 IMPLANT
ELECT PENCIL ROCKER SW 15FT (MISCELLANEOUS) ×3 IMPLANT
ELECT REM PT RETURN 15FT ADLT (MISCELLANEOUS) ×3 IMPLANT
GAUZE 4X4 16PLY ~~LOC~~+RFID DBL (SPONGE) ×3 IMPLANT
GLOVE SURG ORTHO LTX SZ8 (GLOVE) ×3 IMPLANT
GOWN STRL REUS W/TWL XL LVL3 (GOWN DISPOSABLE) ×6 IMPLANT
HEMOSTAT SURGICEL 2X4 FIBR (HEMOSTASIS) ×3 IMPLANT
ILLUMINATOR WAVEGUIDE N/F (MISCELLANEOUS) ×3 IMPLANT
KIT BASIN OR (CUSTOM PROCEDURE TRAY) ×3 IMPLANT
KIT TURNOVER KIT A (KITS) ×3 IMPLANT
PACK BASIC VI WITH GOWN DISP (CUSTOM PROCEDURE TRAY) ×3 IMPLANT
SHEARS HARMONIC 9CM CVD (BLADE) ×3 IMPLANT
SUT MNCRL AB 4-0 PS2 18 (SUTURE) ×3 IMPLANT
SUT VIC AB 3-0 SH 18 (SUTURE) ×6 IMPLANT
SYR BULB IRRIG 60ML STRL (SYRINGE) ×3 IMPLANT
TOWEL OR 17X26 10 PK STRL BLUE (TOWEL DISPOSABLE) ×3 IMPLANT
TOWEL OR NON WOVEN STRL DISP B (DISPOSABLE) ×3 IMPLANT
TUBING CONNECTING 10 (TUBING) ×2 IMPLANT
TUBING CONNECTING 10' (TUBING) ×1

## 2020-08-10 NOTE — Interval H&P Note (Signed)
History and Physical Interval Note:  08/10/2020 8:51 AM  Rebecca York  has presented today for surgery, with the diagnosis of THYROID NEOPLASM UNCERTAIN BEHAVIOR, MULTIPLE THYROID NODULES.  The various methods of treatment have been discussed with the patient and family. After consideration of risks, benefits and other options for treatment, the patient has consented to    Procedure(s): TOTAL THYROIDECTOMY (N/A) as a surgical intervention.    The patient's history has been reviewed, patient examined, no change in status, stable for surgery.  I have reviewed the patient's chart and labs.  Questions were answered to the patient's satisfaction.    Armandina Gemma, MD [facility] Office: [place]

## 2020-08-10 NOTE — Anesthesia Preprocedure Evaluation (Addendum)
Anesthesia Evaluation  Patient identified by MRN, date of birth, ID band Patient awake    Reviewed: Allergy & Precautions, H&P , NPO status , Patient's Chart, lab work & pertinent test results  Airway Mallampati: II  TM Distance: >3 FB Neck ROM: Full    Dental no notable dental hx. (+) Teeth Intact, Dental Advisory Given   Pulmonary asthma ,    Pulmonary exam normal breath sounds clear to auscultation       Cardiovascular negative cardio ROS   Rhythm:Regular Rate:Normal     Neuro/Psych negative neurological ROS  negative psych ROS   GI/Hepatic negative GI ROS, Neg liver ROS,   Endo/Other  Hypothyroidism   Renal/GU negative Renal ROS  negative genitourinary   Musculoskeletal   Abdominal   Peds  Hematology negative hematology ROS (+)   Anesthesia Other Findings   Reproductive/Obstetrics negative OB ROS                            Anesthesia Physical Anesthesia Plan  ASA: 2  Anesthesia Plan: General   Post-op Pain Management:    Induction: Intravenous  PONV Risk Score and Plan: 4 or greater and Ondansetron, Dexamethasone and Midazolam  Airway Management Planned: Oral ETT  Additional Equipment:   Intra-op Plan:   Post-operative Plan: Extubation in OR  Informed Consent: I have reviewed the patients History and Physical, chart, labs and discussed the procedure including the risks, benefits and alternatives for the proposed anesthesia with the patient or authorized representative who has indicated his/her understanding and acceptance.     Dental advisory given  Plan Discussed with: CRNA  Anesthesia Plan Comments:         Anesthesia Quick Evaluation

## 2020-08-10 NOTE — Op Note (Signed)
Procedure Note  Pre-operative Diagnosis:  thyroid neoplasm of uncertain behavior, multiple thyroid nodules  Post-operative Diagnosis:  same  Surgeon:  Armandina Gemma, MD  Assistant:  none   Procedure:  Total thyroidectomy  Anesthesia:  General  Estimated Blood Loss:  minimal  Drains: none         Specimen: thyroid to pathology  Indications:  Patient is referred by Dr. 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  Patient had been noted on routine laboratory studies to have an elevated TSH level of 7.43.  She was evaluated by her primary care physician and an ultrasound was obtained.  The study was performed on June 02, 2020.  It demonstrated to nodules in the right thyroid lobe each measuring 1 cm in size.  There was a 1.5 cm in the superior left thyroid lobe which was suspicious and fine-needle aspiration biopsy was ordered.  Patient underwent biopsy on Jun 21, 2020.  Cytopathology was suspicious for malignancy, Bethesda category V, suspicious for papillary thyroid carcinoma.  Patient now comes to surgery for thyroidectomy.  Procedure Details: Procedure was done in OR #1 at the [facility]. The patient was brought to the operating room and placed in a supine position on the operating room table. Following administration of general anesthesia, the patient was positioned and then prepped and draped in the usual aseptic fashion. After ascertaining that an adequate level of anesthesia had been achieved, a small Kocher incision was made with #15 blade. Dissection was carried through subcutaneous tissues and platysma.Hemostasis was achieved with the electrocautery. Skin flaps were elevated cephalad and caudad from the thyroid notch to the sternal notch. A Mahorner self-retaining retractor was placed for exposure. Strap muscles were incised in the midline and dissection was begun on the left side.  Strap  muscles were reflected laterally.  Left thyroid lobe was normal in size with a firm nodule at the superior pole.  The left lobe was gently mobilized with blunt dissection. Superior pole vessels were dissected out and divided individually between small and medium ligaclips with the harmonic scalpel. The thyroid lobe was rolled anteriorly. Branches of the inferior thyroid artery were divided between small ligaclips with the harmonic scalpel. Inferior venous tributaries were divided between ligaclips. Both the superior and inferior parathyroid glands were identified and preserved on their vascular pedicles. The recurrent laryngeal nerve was identified and preserved along its course. The ligament of Gwenlyn Found was released with the electrocautery and the gland was mobilized onto the anterior trachea. Isthmus was mobilized across the midline. There was no significant pyramidal lobe present. Dry pack was placed in the left neck.  The right thyroid lobe was gently mobilized with blunt dissection. Right thyroid lobe was normal in size with subcentimeter palpable nodules. Superior pole vessels were dissected out and divided between small and medium ligaclips with the Harmonic scalpel. Superior parathyroid was identified and preserved. Inferior venous tributaries were divided between medium ligaclips with the harmonic scalpel. The right thyroid lobe was rolled anteriorly and the branches of the inferior thyroid artery divided between small ligaclips. The right recurrent laryngeal nerve was identified and preserved along its course. The ligament of Gwenlyn Found was released with the electrocautery. The right thyroid lobe was mobilized onto the anterior trachea and the remainder of the thyroid was dissected off the anterior trachea and the thyroid was completely excised. A suture was used to mark the left lobe. The entire thyroid gland was submitted to  pathology for review.  There were a few prominent lymph nodes immediately below the  isthmus in the central compartment.  These were excised with the electrocautery and submitted separately to pathology for review.  The neck was irrigated with warm saline. Fibrillar was placed throughout the operative field. Strap muscles were approximated in the midline with interrupted 3-0 Vicryl sutures. Platysma was closed with interrupted 3-0 Vicryl sutures. Skin was closed with a running 4-0 Monocryl subcuticular suture. Wound was washed and Dermabond was applied. The patient was awakened from anesthesia and brought to the recovery room. The patient tolerated the procedure well.   Armandina Gemma, MD [facility] Office: [phone]

## 2020-08-10 NOTE — Anesthesia Postprocedure Evaluation (Signed)
Anesthesia Post Note  Patient: Rebecca York  Procedure(s) Performed: TOTAL THYROIDECTOMY (Neck)     Patient location during evaluation: PACU Anesthesia Type: General Level of consciousness: awake and alert Pain management: pain level controlled Vital Signs Assessment: post-procedure vital signs reviewed and stable Respiratory status: spontaneous breathing, nonlabored ventilation and respiratory function stable Cardiovascular status: blood pressure returned to baseline and stable Postop Assessment: no apparent nausea or vomiting Anesthetic complications: no   No notable events documented.  Last Vitals:  Vitals:   08/10/20 1447 08/10/20 1537  BP: 112/73 99/61  Pulse: 79 80  Resp: 18 18  Temp: 36.6 C 36.4 C  SpO2: 97% 98%    Last Pain:  Vitals:   08/10/20 1537  TempSrc: Oral  PainSc:                  Nikala Walsworth,W. EDMOND

## 2020-08-10 NOTE — Transfer of Care (Signed)
Immediate Anesthesia Transfer of Care Note  Patient: Rebecca York  Procedure(s) Performed: TOTAL THYROIDECTOMY (Neck)  Patient Location: PACU  Anesthesia Type:General  Level of Consciousness: awake, alert  and oriented  Airway & Oxygen Therapy: Patient Spontanous Breathing and Patient connected to face mask oxygen  Post-op Assessment: Report given to RN and Post -op Vital signs reviewed and stable  Post vital signs: Reviewed and stable  Last Vitals:  Vitals Value Taken Time  BP    Temp    Pulse    Resp    SpO2      Last Pain:  Vitals:   08/10/20 0810  TempSrc:   PainSc: 0-No pain      Patients Stated Pain Goal: 4 (17/00/17 4944)  Complications: No notable events documented.

## 2020-08-10 NOTE — Anesthesia Procedure Notes (Signed)
Procedure Name: Intubation Date/Time: 08/10/2020 9:19 AM Performed by: Sharlette Dense, CRNA Pre-anesthesia Checklist: Patient identified, Emergency Drugs available, Suction available and Patient being monitored Patient Re-evaluated:Patient Re-evaluated prior to induction Oxygen Delivery Method: Circle system utilized Preoxygenation: Pre-oxygenation with 100% oxygen Induction Type: IV induction Ventilation: Mask ventilation without difficulty Laryngoscope Size: Miller and 2 Grade View: Grade I Tube type: Reinforced Tube size: 7.0 mm Number of attempts: 1 Airway Equipment and Method: Stylet and Oral airway Placement Confirmation: ETT inserted through vocal cords under direct vision, positive ETCO2 and breath sounds checked- equal and bilateral Secured at: 21 cm Tube secured with: Tape Dental Injury: Teeth and Oropharynx as per pre-operative assessment

## 2020-08-11 ENCOUNTER — Encounter (HOSPITAL_COMMUNITY): Payer: Self-pay | Admitting: Surgery

## 2020-08-11 DIAGNOSIS — D440 Neoplasm of uncertain behavior of thyroid gland: Secondary | ICD-10-CM | POA: Diagnosis not present

## 2020-08-11 LAB — BASIC METABOLIC PANEL
Anion gap: 7 (ref 5–15)
BUN: 9 mg/dL (ref 6–20)
CO2: 25 mmol/L (ref 22–32)
Calcium: 9.3 mg/dL (ref 8.9–10.3)
Chloride: 106 mmol/L (ref 98–111)
Creatinine, Ser: 0.79 mg/dL (ref 0.44–1.00)
GFR, Estimated: >60 mL/min (ref >60)
Glucose, Bld: 119 mg/dL — ABNORMAL HIGH (ref 70–99)
Potassium: 4.1 mmol/L (ref 3.5–5.1)
Sodium: 138 mmol/L (ref 135–145)

## 2020-08-11 LAB — SURGICAL PATHOLOGY

## 2020-08-11 MED ORDER — ALUM & MAG HYDROXIDE-SIMETH 200-200-20 MG/5ML PO SUSP
30.0000 mL | Freq: Four times a day (QID) | ORAL | Status: DC | PRN
  Filled 2020-08-11: qty 30

## 2020-08-11 MED ORDER — CALCIUM CARBONATE ANTACID 500 MG PO CHEW: 2.0000 | CHEWABLE_TABLET | Freq: Two times a day (BID) | ORAL | 1 refills | Status: DC

## 2020-08-11 MED ORDER — LEVOTHYROXINE SODIUM 100 MCG PO TABS: 100.0000 ug | ORAL_TABLET | Freq: Every day | ORAL | 2 refills | Status: DC

## 2020-08-11 NOTE — Discharge Instructions (Signed)
[facility]  THYROID & PARATHYROID SURGERY:  POST-OP INSTRUCTIONS  Always review your discharge instruction sheet from the facility where your surgery was performed.  A prescription for pain medication may be given to you upon discharge.  Take your pain medication as prescribed.  If narcotic pain medicine is not needed, then you may take acetaminophen (Tylenol) or ibuprofen (Advil) as needed.  Take your usually prescribed medications unless otherwise directed.  If you need a refill on your pain medication, please contact our office during regular business hours.  Prescriptions cannot be processed by our office after 5 pm or on weekends.  Start with a light diet upon arrival home, such as soup and crackers or toast.  Be sure to drink plenty of fluids daily.  Resume your normal diet the day after surgery.  Most patients will experience some swelling and bruising on the chest and neck area.  Ice packs will help.  Swelling and bruising can take several days to resolve.   It is common to experience some constipation after surgery.  Increasing fluid intake and taking a stool softener (Colace) will usually help or prevent this problem.  A mild laxative (Milk of Magnesia or Miralax) should be taken according to package directions if there has been no bowel movement after 48 hours.  You have steri-strips and a gauze dressing over your incision.  You may remove the gauze bandage on the second day after surgery, and you may shower at that time.  Leave your steri-strips (small skin tapes) in place directly over the incision.  These strips should remain on the skin for 5-7 days and then be removed.  You may get them wet in the shower and pat them dry.  You may resume regular (light) daily activities beginning the next day (such as daily self-care, walking, climbing stairs) gradually increasing activities as tolerated.  You may have sexual intercourse when it is comfortable.  Refrain from  any heavy lifting or straining until approved by your doctor.  You may drive when you no longer are taking prescription pain medication, you can comfortably wear a seatbelt, and you can safely maneuver your car and apply brakes.  You should see your doctor in the office for a follow-up appointment approximately three weeks after your surgery.  Make sure that you call for this appointment within a day or two after you arrive home to insure a convenient appointment time.  WHEN TO CALL YOUR DOCTOR: -- Fever greater than 101.5 -- Inability to urinate -- Nausea and/or vomiting - persistent -- Extreme swelling or bruising -- Continued bleeding from incision -- Increased pain, redness, or drainage from the incision -- Difficulty swallowing or breathing -- Muscle cramping or spasms -- Numbness or tingling in hands or around lips  The clinic staff is available to answer your questions during regular business hours.  Please don't hesitate to call and ask to speak to one of the nurses if you have concerns.  Hope Brandenburger, MD [facility] Office: [phone] 

## 2020-08-11 NOTE — Discharge Summary (Signed)
    Physician Discharge Summary [facility]  Patient ID: Rebecca York MRN: [id number] DOB/AGE: [birth year] 41 y.o.  Admit date: 08/10/2020  Discharge date: 08/11/2020  Discharge Diagnoses:  Principal Problem:   Neoplasm of uncertain behavior of thyroid gland Active Problems:   Multinodular goiter   Discharged Condition: good  Hospital Course: Patient was admitted for observation following thyroid surgery.  Post op course was uncomplicated.  Pain was well controlled.  Tolerated diet.  Post op calcium level on morning following surgery was 9.3 mg/dl.  Patient was prepared for discharge home on POD#1.   Consults: None  Treatments: surgery: total thyroidectomy  Discharge Exam: Blood pressure 99/66, pulse 86, temperature 98.6 F (37 C), temperature source Oral, resp. rate 16, height 5\' 6"  (1.676 m), weight 63.8 kg, SpO2 99 %. HEENT - clear Neck - wound dry and intact; mild STS; voice normal Chest - clear bilaterally Cor - RRR   Disposition: Home  Discharge Instructions     Diet - low sodium heart healthy   Complete by: As directed    Increase activity slowly   Complete by: As directed    No dressing needed   Complete by: As directed       Allergies as of 08/11/2020       Reactions   Buspirone Other (See Comments)   headache        Medication List     TAKE these medications    calcium carbonate 500 MG chewable tablet Commonly known as: Tums Chew 2 tablets (400 mg of elemental calcium total) by mouth 2 (two) times daily.   levothyroxine 100 MCG tablet Commonly known as: Synthroid Take 1 tablet (100 mcg total) by mouth daily before breakfast.               Discharge Care Instructions  (From admission, onward)           Start     Ordered   08/11/20 0000  No dressing needed        08/11/20 7622            Follow-up Information     Armandina Gemma, MD. Schedule an appointment as soon as possible for a visit in 3 week(s).    Specialty: General Surgery Why: For wound re-check Contact information: [place] [address] [phone]                 Armandina Gemma, [facility] Office: [phone]   Signed: Armandina Gemma 08/11/2020, 8:51 AM

## 2020-10-20 ENCOUNTER — Telehealth: Payer: Self-pay

## 2020-10-20 NOTE — Telephone Encounter (Signed)
[facility] called and wanted the patient to be seen. Advised her to let pt know to get labs done and call us for an appointment. She had her thyroid removed on 08/10/20

## 2020-11-01 ENCOUNTER — Ambulatory Visit: Payer: Medicaid Other | Admitting: "Endocrinology

## 2020-11-01 ENCOUNTER — Other Ambulatory Visit: Payer: Self-pay

## 2020-11-01 ENCOUNTER — Encounter: Payer: Self-pay | Admitting: "Endocrinology

## 2020-11-01 VITALS — BP 102/64 | HR 76 | Ht 66.0 in | Wt 140.2 lb

## 2020-11-01 DIAGNOSIS — C73 Malignant neoplasm of thyroid gland: Secondary | ICD-10-CM | POA: Diagnosis not present

## 2020-11-01 DIAGNOSIS — E890 Postprocedural hypothyroidism: Secondary | ICD-10-CM | POA: Insufficient documentation

## 2020-11-01 NOTE — Progress Notes (Signed)
11/01/2020, 12:22 PM  Endocrinology follow-up note   Subjective:    Patient ID: Rebecca York, female    DOB: 03/25/1980, PCP Kotturi, Tyler Deis, MD   Past Medical History:  Diagnosis Date   Asthma    Past Surgical History:  Procedure Laterality Date   DILATION AND CURETTAGE OF UTERUS     THYROIDECTOMY N/A 08/10/2020   Procedure: TOTAL THYROIDECTOMY;  Surgeon: Armandina Gemma, MD;  Location: [facility];  Service: General;  Laterality: N/A;   TUBAL LIGATION     Social History   Socioeconomic History   Marital status: Divorced    Spouse name: Not on file   Number of children: Not on file   Years of education: Not on file   Highest education level: Not on file  Occupational History   Not on file  Tobacco Use   Smoking status: Never   Smokeless tobacco: Never  Vaping Use   Vaping Use: Never used  Substance and Sexual Activity   Alcohol use: Never   Drug use: Never   Sexual activity: Not on file  Other Topics Concern   Not on file  Social History Narrative   Not on file   Social Determinants of Health   Financial Resource Strain: Not on file  Food Insecurity: Not on file  Transportation Needs: Not on file  Physical Activity: Not on file  Stress: Not on file  Social Connections: Not on file   Family History  Problem Relation Age of Onset   Thyroid disease Mother    Cancer Father    Outpatient Encounter Medications as of 11/01/2020  Medication Sig   levothyroxine (SYNTHROID) 125 MCG tablet Take 1 tablet by mouth daily.   sertraline (ZOLOFT) 100 MG tablet Take 100 mg by mouth daily.   [DISCONTINUED] calcium carbonate (TUMS) 500 MG chewable tablet Chew 2 tablets (400 mg of elemental calcium total) by mouth 2 (two) times daily. (Patient not taking: Reported on 11/01/2020)   [DISCONTINUED] levothyroxine (SYNTHROID) 100 MCG tablet Take 1 tablet (100 mcg total) by mouth daily before breakfast.   No facility-administered encounter  medications on file as of 11/01/2020.   ALLERGIES: Allergies  Allergen Reactions   Buspirone Other (See Comments)    headache    VACCINATION STATUS:  There is no immunization history on file for this patient.  HPI Rebecca York is 39 y.o. 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  She is recovering very well from her surgery.   See notes from previous visits. After appropriate work-up confirmed thyroid malignancy she underwent total thyroidectomy by Dr. Harlow Asa on August 10, 2020.  Final microscopic diagnosis was multifocal papillary thyroid carcinoma bilateral, multifocal.  Margins not involved, chronic lymphocytic thyroiditis on the background.  4 lymph nodes negative for metastasis. She is currently on levothyroxine 125 mcg p.o. daily before breakfast.  She was treated briefly with calcium supplements.  She denies dysphagia, shortness of breath, nor voice change.  She does not have any family history of thyroid malignancy.  She has family history of various thyroid dysfunctions.  She denies palpitations, tremors.   Review of Systems  Constitutional: + Minimally fluctuating body weight,   no  fatigue, no subjective hyperthermia, no subjective hypothermia    Objective:    Vitals with BMI 11/01/2020 08/11/2020 08/11/2020  Height 5\' 6"  - -  Weight 140 lbs 3 oz - -  BMI 66.29 - -  Systolic 476 99 97  Diastolic 64 66 57  Pulse 76 86 76    BP 102/64   Pulse 76   Ht 5\' 6"  (1.676 m)   Wt 140 lb 3.2 oz (63.6 kg)   BMI 22.63 kg/m   Wt Readings from Last 3 Encounters:  11/01/20 140 lb 3.2 oz (63.6 kg)  08/10/20 140 lb 9.6 oz (63.8 kg)  07/31/20 140 lb 9.6 oz (63.8 kg)    Physical Exam  Constitutional:  Body mass index is 22.63 kg/m.,  not in acute distress, normal state of mind Eyes: PERRLA, EOMI, no exophthalmos ENT: moist mucous membranes, + palpable nodular thyroid  enlarged.     CMP (  most recent) CMP     Component Value Date/Time   NA 138 08/11/2020 0355   K 4.1 08/11/2020 0355   CL 106 08/11/2020 0355   CO2 25 08/11/2020 0355   GLUCOSE 119 (H) 08/11/2020 0355   BUN 9 08/11/2020 0355   BUN 13 05/12/2020 0000   CREATININE 0.79 08/11/2020 0355   CALCIUM 9.3 08/11/2020 0355   GFRNONAA >60 08/11/2020 0355     Diabetic Labs (most recent): Lab Results  Component Value Date   HGBA1C 5.1 05/12/2020     Lipid Panel ( most recent) Lipid Panel     Component Value Date/Time   CHOL 132 05/12/2020 0000   TRIG 69 05/12/2020 0000   HDL 44 05/12/2020 0000   LDLCALC 74 05/12/2020 0000      Lab Results  Component Value Date   TSH 1.840 10/26/2020   TSH 7.43 (A) 05/12/2020   FREET4 1.57 10/26/2020    June 02, 2020 thyroid ultrasound: Right lobe 5.2 cm with 2 nodules measuring 1 cm each.  Left lobe measured 4.7 cm with 1 nodule measuring 1.5 cm.  The left-sided nodule is felt to be suspicious and needs fine-needle aspiration biopsy.      Clinical History: 1.5 cm LUL  Specimen Submitted:  A. THYROID GLAND, LEFT LOBE, FINE NEEDLE  ASPIRATION:  FINAL MICROSCOPIC DIAGNOSIS:  - Suspicious for malignancy (Bethesda category V)   SPECIMEN ADEQUACY:  Satisfactory for evaluation   DIAGNOSTIC COMMENTS:  Suspicious for papillary carcinoma  Afirma testing is available upon clinician request.   August 10, 2020 total thyroidectomy:  FINAL MICROSCOPIC DIAGNOSIS:   A. THYROID, TOTAL THYROIDECTOMY:  - Multifocal papillary thyroid carcinoma.  - Margins not involved.  - Chronic lymphocytic thyroiditis (Hashimoto's).  - See oncology table.   B. LYMPH NODE, CENTRAL COMPARTMENT, EXCISION:  - Four lymph nodes negative for metastatic carcinoma (0/4).   Assessment & Plan:   1.  Papillary thyroid malignancy on the background of Hashimoto's thyroiditis  2.  Postsurgical hypothyroidism    I discussed her surgical pathology findings with her.  In light of multifocal,  bilateral, follicular variant component of papillary thyroid carcinoma, and relative youth of the patient and detectable thyroglobulin at 3.4,  She would benefit from adjuvant therapy with I-131 thyroid remnant ablation.   I discussed the expectations and the need for strict follow-up for surveillance afterwards.  Thyrogen stimulated thyroid remnant ablation followed by whole-body scan will be facilitated to be performed in [facility]. She will have labs for thyroglobulin on the day of I-131 dosing.  After wards, if no uptake is detected outside of the neck, she will be considered for yearly imaging study for surveillance.  Regarding her postsurgical hypothyroidism: Her recent previsit thyroid function tests are consistent with appropriate replacement with the exception of detectable thyroglobulin.  Thyroglobulin antibodies are pending. She is advised to continue levothyroxine 125 mcg p.o. daily before breakfast.   - We discussed about the correct intake of her thyroid hormone, on empty stomach at fasting, with water, separated by at least 30 minutes from breakfast and other medications,  and separated by more than 4 hours from calcium, iron, multivitamins, acid reflux medications (PPIs). -Patient is made aware of the fact that thyroid hormone replacement is needed for life, dose to be adjusted by periodic monitoring of thyroid function tests.  She has unexplained fluctuating body weight more recently more lost and gained.  She is advised to update her screening studies including Pap smear.  She is scheduled to see her OB/GYN provider in the near future.     - she is advised to maintain close follow up with Kotturi, Tyler Deis, MD for primary care needs.   I spent 33 minutes in the care of the patient today including review of labs from Thyroid Function, CMP, and other relevant labs ; imaging/biopsy records (current and previous including abstractions from other facilities); face-to-face time  discussing  her lab results and symptoms, medications doses, her options of short and long term treatment based on the latest standards of care / guidelines;   and documenting the encounter.  Rebecca York  participated in the discussions, expressed understanding, and voiced agreement with the above plans.  All questions were answered to her satisfaction. she is encouraged to contact clinic should she have any questions or concerns prior to her return visit.   Follow up plan: Return in about 5 weeks (around 12/06/2020) for F/U with Whole Body Scan w/Thyrogen, F/U with Pre-visit Labs.   Glade Lloyd, MD [facility] [address] [place], [place] [address] Phone: [phone]  Fax: [phone]     11/01/2020, 12:22 PM  This note was partially dictated with voice recognition software. Similar sounding words can be transcribed inadequately or may not  be corrected upon review.

## 2020-11-02 NOTE — Written Directive (Addendum)
MOLECULAR IMAGING AND THERAPEUTICS WRITTEN DIRECTIVE   PATIENT NAME: Rebecca York  PT DOB:   [birth year]                                              MRN: [id number]  ---------------------------------------------------------------------------------------------------------------------   I-131 THYROID CANCER THERAPY   RADIOPHARMACEUTICAL:  Iodine-131 Capsule    PRESCRIBED DOSE FOR ADMINISTRATION:  50 millicuries R-416 NaI   ROUTE OFADMINISTRATION:  PO   DIAGNOSIS: Thyroid Cancer   REFERRING PHYSICIAN: Dr. Donia Ast STIMULATION OR HORMONE WITHDRAW: Thyrogen Stimulation   REMNANT ABLATION OR ADJUVANT THERAPY:  Remnant ablation   DATE OF THYROIDECTOMY: 08/10/2020   SURGEON: Dr. Harlow Asa   TSH:   Lab Results  Component Value Date   TSH 1.840 10/26/2020   TSH 7.43 (A) 05/12/2020     PRIOR I-131 THERAPY (Date and Dose):   Pathology:  multifocal, bilateral  Cell type: [x]   Papillary  [x]   Follicular  []   [place]   Largest tumor focus:   1.5   cm  Extrathyroidal Extension?     Yes []   No [x]     Lymphovascular Invasion?  Yes  []   No  [x]     Margins positive ? Yes []   No [x]     Lymph nodes positive? Yes []   No  [x]       # positive nodes:  0 # negative nodes:  4   TNM staging: pT:  1b       PN:     0    Mx:    ADDITIONAL PHYSICIAN COMMENTS/NOTES   AUTHORIZED USER SIGNATURE & TIME STAMP:       Stephone Gum A. Thornton Papas, M.D.

## 2020-11-05 LAB — TSH: TSH: 1.84 u[IU]/mL (ref 0.450–4.500)

## 2020-11-05 LAB — T4, FREE: Free T4: 1.57 ng/dL (ref 0.82–1.77)

## 2020-11-05 LAB — THYROGLOBULIN ANTIBODY: Thyroglobulin Antibody: 3.4 IU/mL — ABNORMAL HIGH (ref 0.0–0.9)

## 2020-11-05 LAB — THYROGLOBULIN LEVEL: Thyroglobulin (TG-RIA): 3.5 ng/mL

## 2020-11-08 ENCOUNTER — Other Ambulatory Visit: Payer: Self-pay

## 2020-11-08 ENCOUNTER — Ambulatory Visit (HOSPITAL_COMMUNITY)
Admission: RE | Admit: 2020-11-08 | Discharge: 2020-11-08 | Disposition: A | Discharge status: Home / Self Care | Payer: Medicaid Other | Source: Ambulatory Visit | Attending: "Endocrinology | Admitting: "Endocrinology

## 2020-11-08 ENCOUNTER — Encounter (HOSPITAL_COMMUNITY): Payer: Self-pay

## 2020-11-08 DIAGNOSIS — E890 Postprocedural hypothyroidism: Secondary | ICD-10-CM | POA: Diagnosis present

## 2020-11-08 DIAGNOSIS — C73 Malignant neoplasm of thyroid gland: Secondary | ICD-10-CM | POA: Diagnosis not present

## 2020-11-08 HISTORY — DX: Malignant (primary) neoplasm, unspecified: C80.1

## 2020-11-08 MED ORDER — THYROTROPIN ALFA 0.9 MG IM SOLR
INTRAMUSCULAR | Status: AC
  Administered 2020-11-08: 0.9 mg via INTRAMUSCULAR
  Filled 2020-11-08: qty 0.9

## 2020-11-08 MED ORDER — STERILE WATER FOR INJECTION IJ SOLN
INTRAMUSCULAR | Status: AC
  Administered 2020-11-08: 1.2 mL via INTRAMUSCULAR
  Filled 2020-11-08: qty 10

## 2020-11-08 MED ORDER — THYROTROPIN ALFA 0.9 MG IM SOLR: 0.9000 mg | INTRAMUSCULAR | Status: AC

## 2020-11-08 MED ORDER — STERILE WATER FOR INJECTION IJ SOLN: 1.2000 mL | Freq: Once | INTRAMUSCULAR | Status: AC

## 2020-11-09 ENCOUNTER — Ambulatory Visit (HOSPITAL_COMMUNITY)
Admission: RE | Admit: 2020-11-09 | Discharge: 2020-11-09 | Disposition: A | Discharge status: Home / Self Care | Payer: Medicaid Other | Source: Ambulatory Visit | Attending: "Endocrinology | Admitting: "Endocrinology

## 2020-11-09 ENCOUNTER — Encounter (HOSPITAL_COMMUNITY): Payer: Self-pay

## 2020-11-09 DIAGNOSIS — C73 Malignant neoplasm of thyroid gland: Secondary | ICD-10-CM

## 2020-11-09 DIAGNOSIS — E890 Postprocedural hypothyroidism: Secondary | ICD-10-CM

## 2020-11-09 MED ORDER — THYROTROPIN ALFA 0.9 MG IM SOLR: 0.9000 mg | INTRAMUSCULAR | Status: AC

## 2020-11-09 MED ORDER — THYROTROPIN ALFA 0.9 MG IM SOLR
INTRAMUSCULAR | Status: AC
  Administered 2020-11-09: 0.9 mg via INTRAMUSCULAR
  Filled 2020-11-09: qty 0.9

## 2020-11-09 MED ORDER — STERILE WATER FOR INJECTION IJ SOLN
1.2000 mL | Freq: Once | INTRAMUSCULAR | Status: AC
  Administered 2020-11-09: 1.2 mL via INTRAMUSCULAR

## 2020-11-10 ENCOUNTER — Other Ambulatory Visit: Payer: Self-pay

## 2020-11-10 ENCOUNTER — Encounter (HOSPITAL_COMMUNITY)
Admission: RE | Admit: 2020-11-10 | Discharge: 2020-11-10 | Disposition: A | Discharge status: Home / Self Care | Payer: Medicaid Other | Source: Ambulatory Visit | Attending: "Endocrinology | Admitting: "Endocrinology

## 2020-11-10 ENCOUNTER — Encounter (HOSPITAL_COMMUNITY): Payer: Self-pay

## 2020-11-10 ENCOUNTER — Other Ambulatory Visit (HOSPITAL_COMMUNITY)
Admission: RE | Admit: 2020-11-10 | Discharge: 2020-11-10 | Disposition: A | Discharge status: Home / Self Care | Payer: Medicaid Other | Source: Ambulatory Visit | Attending: "Endocrinology | Admitting: "Endocrinology

## 2020-11-10 DIAGNOSIS — E890 Postprocedural hypothyroidism: Secondary | ICD-10-CM | POA: Diagnosis not present

## 2020-11-10 DIAGNOSIS — C73 Malignant neoplasm of thyroid gland: Secondary | ICD-10-CM | POA: Diagnosis not present

## 2020-11-10 LAB — TSH: TSH: 197.586 u[IU]/mL — ABNORMAL HIGH (ref 0.350–4.500)

## 2020-11-10 MED ORDER — SODIUM IODIDE I 131 CAPSULE
50.0000 | Freq: Once | INTRAVENOUS | Status: AC | PRN
  Administered 2020-11-10: 50.9 via ORAL

## 2020-11-12 LAB — THYROGLOBULIN ANTIBODY: Thyroglobulin Antibody: 3.4 IU/mL — ABNORMAL HIGH (ref 0.0–0.9)

## 2020-11-16 LAB — THYROGLOBULIN LEVEL: Thyroglobulin: 2.8 ng/mL

## 2020-11-20 ENCOUNTER — Other Ambulatory Visit: Payer: Self-pay

## 2020-11-20 ENCOUNTER — Encounter (HOSPITAL_COMMUNITY)
Admission: RE | Admit: 2020-11-20 | Discharge: 2020-11-20 | Disposition: A | Discharge status: Home / Self Care | Payer: Medicaid Other | Source: Ambulatory Visit | Attending: "Endocrinology | Admitting: "Endocrinology

## 2020-11-20 DIAGNOSIS — E890 Postprocedural hypothyroidism: Secondary | ICD-10-CM | POA: Insufficient documentation

## 2020-11-20 DIAGNOSIS — C73 Malignant neoplasm of thyroid gland: Secondary | ICD-10-CM | POA: Insufficient documentation

## 2020-12-06 ENCOUNTER — Other Ambulatory Visit: Payer: Self-pay

## 2020-12-06 ENCOUNTER — Encounter: Payer: Self-pay | Admitting: "Endocrinology

## 2020-12-06 ENCOUNTER — Ambulatory Visit: Payer: Medicaid Other | Admitting: "Endocrinology

## 2020-12-06 VITALS — BP 92/68 | HR 72 | Ht 66.0 in | Wt 138.8 lb

## 2020-12-06 DIAGNOSIS — E890 Postprocedural hypothyroidism: Secondary | ICD-10-CM

## 2020-12-06 DIAGNOSIS — C73 Malignant neoplasm of thyroid gland: Secondary | ICD-10-CM | POA: Diagnosis not present

## 2020-12-06 NOTE — Progress Notes (Signed)
11/01/2020, 12:22 PM  Endocrinology follow-up note   Subjective:    Patient ID: Rebecca York, female    DOB: April 18, 1980, PCP Kotturi, Tyler Deis, MD   Past Medical History:  Diagnosis Date   Asthma    Past Surgical History:  Procedure Laterality Date   DILATION AND CURETTAGE OF UTERUS     THYROIDECTOMY N/A 08/10/2020   Procedure: TOTAL THYROIDECTOMY;  Surgeon: Armandina Gemma, MD;  Location: [facility];  Service: General;  Laterality: N/A;   TUBAL LIGATION     Social History   Socioeconomic History   Marital status: Divorced    Spouse name: Not on file   Number of children: Not on file   Years of education: Not on file   Highest education level: Not on file  Occupational History   Not on file  Tobacco Use   Smoking status: Never   Smokeless tobacco: Never  Vaping Use   Vaping Use: Never used  Substance and Sexual Activity   Alcohol use: Never   Drug use: Never   Sexual activity: Not on file  Other Topics Concern   Not on file  Social History Narrative   Not on file   Social Determinants of Health   Financial Resource Strain: Not on file  Food Insecurity: Not on file  Transportation Needs: Not on file  Physical Activity: Not on file  Stress: Not on file  Social Connections: Not on file   Family History  Problem Relation Age of Onset   Thyroid disease Mother    Cancer Father    Outpatient Encounter Medications as of 11/01/2020  Medication Sig   levothyroxine (SYNTHROID) 125 MCG tablet Take 1 tablet by mouth daily.   sertraline (ZOLOFT) 100 MG tablet Take 100 mg by mouth daily.   [DISCONTINUED] calcium carbonate (TUMS) 500 MG chewable tablet Chew 2 tablets (400 mg of elemental calcium total) by mouth 2 (two) times daily. (Patient not taking: Reported on 11/01/2020)   [DISCONTINUED] levothyroxine (SYNTHROID) 100 MCG tablet Take 1 tablet (100 mcg total) by mouth daily before breakfast.   No facility-administered encounter  medications on file as of 11/01/2020.   ALLERGIES: Allergies  Allergen Reactions   Buspirone Other (See Comments)    headache    VACCINATION STATUS:  There is no immunization history on file for this patient.  HPI Rebecca York is 40 y.o. 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  She is recovering very well from her surgery.   See notes from previous visits. After appropriate work-up confirmed thyroid malignancy she underwent total thyroidectomy by Dr. Harlow Asa on August 10, 2020.  Final microscopic diagnosis was multifocal papillary thyroid carcinoma bilateral, multifocal.  Margins not involved, chronic lymphocytic thyroiditis on the background.  4 lymph nodes negative for metastasis. She is currently on levothyroxine 125 mcg p.o. daily before breakfast.  She was treated briefly with calcium supplements.  She denies dysphagia, shortness of breath, nor voice change.  She does not have any family history of thyroid malignancy.  She has family history of various thyroid dysfunctions.  She denies palpitations, tremors.   Review of Systems  Constitutional: + Minimally fluctuating body weight,   no  fatigue, no subjective hyperthermia, no subjective hypothermia    Objective:    Vitals with BMI 11/01/2020 08/11/2020 08/11/2020  Height 5\' 6"  - -  Weight 140 lbs 3 oz - -  BMI 25.05 - -  Systolic 397 99 97  Diastolic 64 66 57  Pulse 76 86 76    BP 102/64   Pulse 76   Ht 5\' 6"  (1.676 m)   Wt 140 lb 3.2 oz (63.6 kg)   BMI 22.63 kg/m   Wt Readings from Last 3 Encounters:  11/01/20 140 lb 3.2 oz (63.6 kg)  08/10/20 140 lb 9.6 oz (63.8 kg)  07/31/20 140 lb 9.6 oz (63.8 kg)    Physical Exam  Constitutional:  Body mass index is 22.63 kg/m.,  not in acute distress, normal state of mind Eyes: PERRLA, EOMI, no exophthalmos ENT: moist mucous membranes, + palpable nodular thyroid  enlarged.     CMP (  most recent) CMP     Component Value Date/Time   NA 138 08/11/2020 0355   K 4.1 08/11/2020 0355   CL 106 08/11/2020 0355   CO2 25 08/11/2020 0355   GLUCOSE 119 (H) 08/11/2020 0355   BUN 9 08/11/2020 0355   BUN 13 05/12/2020 0000   CREATININE 0.79 08/11/2020 0355   CALCIUM 9.3 08/11/2020 0355   GFRNONAA >60 08/11/2020 0355     Diabetic Labs (most recent): Lab Results  Component Value Date   HGBA1C 5.1 05/12/2020     Lipid Panel ( most recent) Lipid Panel     Component Value Date/Time   CHOL 132 05/12/2020 0000   TRIG 69 05/12/2020 0000   HDL 44 05/12/2020 0000   LDLCALC 74 05/12/2020 0000      Lab Results  Component Value Date   TSH 1.840 10/26/2020   TSH 7.43 (A) 05/12/2020   FREET4 1.57 10/26/2020    June 02, 2020 thyroid ultrasound: Right lobe 5.2 cm with 2 nodules measuring 1 cm each.  Left lobe measured 4.7 cm with 1 nodule measuring 1.5 cm.  The left-sided nodule is felt to be suspicious and needs fine-needle aspiration biopsy.      Clinical History: 1.5 cm LUL  Specimen Submitted:  A. THYROID GLAND, LEFT LOBE, FINE NEEDLE  ASPIRATION:  FINAL MICROSCOPIC DIAGNOSIS:  - Suspicious for malignancy (Bethesda category V)   SPECIMEN ADEQUACY:  Satisfactory for evaluation   DIAGNOSTIC COMMENTS:  Suspicious for papillary carcinoma  Afirma testing is available upon clinician request.   August 10, 2020 total thyroidectomy:  FINAL MICROSCOPIC DIAGNOSIS:   A. THYROID, TOTAL THYROIDECTOMY:  - Multifocal papillary thyroid carcinoma.  - Margins not involved.  - Chronic lymphocytic thyroiditis (Hashimoto's).  - See oncology table.   B. LYMPH NODE, CENTRAL COMPARTMENT, EXCISION:  - Four lymph nodes negative for metastatic carcinoma (0/4).    Thyrogen stimulated remnant ablation followed by posttherapy whole-body scan from September 10- November 20, 2020.  FINDINGS: Small focus of tracer uptake at RIGHT thyroid bed consistent with thyroid remnant.   No  abnormal sites of radio iodine accumulation are identified to suggest iodine avid metastatic thyroid cancer.   IMPRESSION: No scintigraphic evidence of iodine avid metastatic thyroid cancer.  Small RIGHT bed thyroid remnant.    Assessment & Plan:   1.  Papillary thyroid malignancy on the background of Hashimoto's thyroiditis  2.  Postsurgical hypothyroidism    I discussed her imaging studies, lab work, as well as surgical pathology findings with her.  In light of multifocal, bilateral,  follicular variant component of papillary thyroid carcinoma, and relative youth of the patient and detectable thyroglobulin at 3.4.  She is a status post adjuvant therapy with I-131 thyroid remnant ablation.   Posttherapy scan showed small right thyroid bed remnant with no evidence of iodine avid metastatic thyroid cancer. She has detectable thyroglobulin, will need follow-up, with at least yearly imaging studies.  I discussed the expectations and the need for strict follow-up for surveillance afterwards.   Regarding her postsurgical hypothyroidism: Her recent previsit thyroid function tests are consistent with appropriate replacement with the exception of detectable thyroglobulin.  Thyroglobulin antibodies are negative, with detectable thyroglobulin at 3.4. She is advised to continue levothyroxine 125 mcg p.o. daily before breakfast.    - We discussed about the correct intake of her thyroid hormone, on empty stomach at fasting, with water, separated by at least 30 minutes from breakfast and other medications,  and separated by more than 4 hours from calcium, iron, multivitamins, acid reflux medications (PPIs). -Patient is made aware of the fact that thyroid hormone replacement is needed for life, dose to be adjusted by periodic monitoring of thyroid function tests.   She has steady weight lately, she explains poor appetite.    She is advised to update her screening studies including Pap smear.  She is  scheduled to see her OB/GYN provider in the near future.     - she is advised to maintain close follow up with Kotturi, Tyler Deis, MD for primary care needs.    I spent 33 minutes in the care of the patient today including review of labs from Thyroid Function, CMP, and other relevant labs ; imaging/biopsy records (current and previous including abstractions from other facilities); face-to-face time discussing  her lab results and symptoms, medications doses, her options of short and long term treatment based on the latest standards of care / guidelines;   and documenting the encounter.  Rebecca York  participated in the discussions, expressed understanding, and voiced agreement with the above plans.  All questions were answered to her satisfaction. she is encouraged to contact clinic should she have any questions or concerns prior to her return visit.    Follow up plan: Return in about 5 weeks (around 12/06/2020) for F/U with Whole Body Scan w/Thyrogen, F/U with Pre-visit Labs.   Glade Lloyd, MD [facility] [address] [place], [place] [address] Phone: [phone]  Fax: [phone]     11/01/2020, 12:22 PM  This note was partially dictated with voice recognition software. Similar sounding words can be transcribed inadequately or may not  be corrected upon review.

## 2021-04-13 ENCOUNTER — Encounter: Payer: Self-pay | Admitting: Cardiology

## 2021-04-13 ENCOUNTER — Ambulatory Visit: Payer: Medicaid Other | Admitting: Cardiology

## 2021-04-13 ENCOUNTER — Ambulatory Visit (INDEPENDENT_AMBULATORY_CARE_PROVIDER_SITE_OTHER): Payer: Medicaid Other

## 2021-04-13 ENCOUNTER — Other Ambulatory Visit: Payer: Self-pay

## 2021-04-13 VITALS — BP 102/70 | HR 70 | Ht 66.0 in | Wt 142.0 lb

## 2021-04-13 DIAGNOSIS — Z8249 Family history of ischemic heart disease and other diseases of the circulatory system: Secondary | ICD-10-CM

## 2021-04-13 DIAGNOSIS — R0609 Other forms of dyspnea: Secondary | ICD-10-CM

## 2021-04-13 DIAGNOSIS — R42 Dizziness and giddiness: Secondary | ICD-10-CM

## 2021-04-13 DIAGNOSIS — I959 Hypotension, unspecified: Secondary | ICD-10-CM | POA: Insufficient documentation

## 2021-04-13 DIAGNOSIS — I950 Idiopathic hypotension: Secondary | ICD-10-CM

## 2021-04-13 NOTE — Progress Notes (Unsigned)
Enrolled patient for a 7 day Zio XT monitor to be mailed to patients home.  

## 2021-04-13 NOTE — Patient Instructions (Addendum)
Medication Instructions:  ? ?No changes  ? ?*If you need a refill on your cardiac medications before your next appointment, please call your pharmacy* ? ? ?Lab Work: ? ?Not needed ? ? ?Testing/Procedures: ?Will be schedule [address] ?Your physician has requested that you have an echocardiogram. Echocardiography is a painless test that uses sound waves to create images of your heart. It provides your doctor with information about the size and shape of your heart and how well your heart?s chambers and valves are working. This procedure takes approximately one hour. There are no restrictions for this procedure.  ?And ? ?Your physician has recommended that you wear a holter monitor 7 days Zio . Holter monitors are medical devices that record the heart?s electrical activity. Doctors most often use these monitors to diagnose arrhythmias. Arrhythmias are problems with the speed or rhythm of the heartbeat. The monitor is a small, portable device. You can wear one while you do your normal daily activities. This is usually used to diagnose what is causing palpitations/syncope (passing out).  ? ?Follow-Up: ?At [facility], you and your health needs are our priority.  As part of our continuing mission to provide you with exceptional heart care, we have created designated Provider Care Teams.  These Care Teams include your primary Cardiologist (physician) and Advanced Practice Providers (APPs -  Physician Assistants and Nurse Practitioners) who all work together to provide you with the care you need, when you need it. ? ?  ? ?Your next appointment:   ?3 month(s) ? ?The format for your next appointment:   ?In Person ? ?Provider:   ?Glenetta Hew ,MD ? ? ?Other Instructions  ?Hydrate , hydrate, hydrate ?Liberal salt ? ? ?ZIO XT- Long Term Monitor Instructions ? ?Your physician has requested you wear a ZIO Rauen monitor for 7 days.  ?This is a single Pica monitor. [employer] supplies one Boltz monitor per  enrollment. Additional ?stickers are not available. Please do not apply Sailors if you will be having a Nuclear Stress Test,  ?Echocardiogram, Cardiac CT, MRI, or Chest Xray during the period you would be wearing the  ?monitor. The Shober cannot be worn during these tests. You cannot remove and re-apply the  ?ZIO XT Dority monitor.  ?Your ZIO Maes monitor will be mailed 3 day USPS to your address on file. It may take 3-5 days  ?to receive your monitor after you have been enrolled.  ?Once you have received your monitor, please review the enclosed instructions. Your monitor  ?has already been registered assigning a specific monitor serial # to you. ? ?Billing and Patient Assistance Program Information ? ?We have supplied [employer] with any of your insurance information on file for billing purposes. ?[employer] offers a sliding scale Patient Assistance Program for patients that do not have  ?insurance, or whose insurance does not completely cover the cost of the ZIO monitor.  ?You must apply for the Patient Assistance Program to qualify for this discounted rate.  ?To apply, please call [employer] at [phone], select option 4, select option 2, ask to apply for  ?Patient Assistance Program. Theodore Demark will ask your household income, and how many people  ?are in your household. They will quote your out-of-pocket cost based on that information.  ?[employer] will also be able to set up a 59-month, interest-free payment plan if needed. ? ?Applying the monitor ?  ?Shave hair from upper left chest.  ?Hold abrader disc by orange tab. Rub abrader in 40 strokes  over the upper left chest as  ?indicated in your monitor instructions.  ?Clean area with 4 enclosed alcohol pads. Let dry.  ?Apply Cutter as indicated in monitor instructions. Crilly will be placed under collarbone on left  ?side of chest with arrow pointing upward.  ?Rub Remer adhesive wings for 2 minutes. Remove white label marked "1". Remove the white  ?label marked "2". Rub Mealy  adhesive wings for 2 additional minutes.  ?While looking in a mirror, press and release button in center of G[place]. A small green light will  ?flash 3-4 times. This will be your only indicator that the monitor has been turned on.  ?Do not shower for the first 24 hours. You may shower after the first 24 hours.  ?Press the button if you feel a symptom. You will hear a small click. Record Date, Time and  ?Symptom in the Patient Logbook.  ?When you are ready to remove the Dorton, follow instructions on the last 2 pages of Patient  ?Logbook. Stick Naeve monitor onto the last page of Patient Logbook.  ?Place Patient Logbook in the blue and white box. Use locking tab on box and tape box closed  ?securely. The blue and white box has prepaid postage on it. Please place it in the mailbox as  ?soon as possible. Your physician should have your test results approximately 7 days after the  ?monitor has been mailed back to [facility].  ?Call [facility] at [phone] if you have questions regarding  ?your ZIO XT Tabak monitor. Call them immediately if you see an orange light blinking on your  ?monitor.  ?If your monitor falls off in less than 4 days, contact our Monitor department at [phone].  ?If your monitor becomes loose or falls off after 4 days call [employer] at [phone] for  ?suggestions on securing your monitor  ?

## 2021-04-13 NOTE — Progress Notes (Signed)
Primary Care Provider: Wilburt Finlay, MD -[place] [facility] [place] Cardiologist: Glenetta Hew, MD Electrophysiologist: None  Clinic Note: Chief Complaint  Patient presents with   New Patient (Initial Visit)    Referred by PCP for Dizziness ,Hypotention & DOE    ===================================  ASSESSMENT/PLAN   Problem List Items Addressed This Visit       Cardiology Problems   Hypotension arterial (Chronic)    Seems chronic.  Not on any medications.  I think she just has low underlying blood pressure.  Stressed the importance of adequate hydration: 70 to 80 ounces of water a day, liberalize salt intake. If she will be sitting or standing for long periods of time would consider support hose.      Relevant Orders   EKG 12-Lead   ECHOCARDIOGRAM COMPLETE   LONG TERM MONITOR (3-14 DAYS)     Other   Family history of ventricular tachycardia (Chronic)    Somewhat concerning past medical history with a sister having polymorphic VT.  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  There is no suggestion that she knew anything about any type of cardiomyopathy.  Her sister was actually about to be admitted for ICD placement when she had cardiac arrest.  Plan:  We will check 2D echocardiogram to exclude cardiomyopathy, and  7-day Zio Sherburn monitor to exclude arrhythmia.      Relevant Orders   EKG 12-Lead   ECHOCARDIOGRAM COMPLETE   LONG TERM MONITOR (3-14 DAYS)   Dizziness of unknown etiology - Primary    Dizziness sounds like it is vertiginous in nature, however could be exacerbated by hypotension.  She does not really sense any palpitations, however her sister had a history of polymorphic V. tach and therefore we need to exclude potential arrhythmias that are occult.  For now we will start with a 7-day Zio Guldin monitor since she is noticing dizziness just about every day.      Relevant Orders   EKG 12-Lead    ECHOCARDIOGRAM COMPLETE   LONG TERM MONITOR (3-14 DAYS)   DOE (dyspnea on exertion) (Chronic)    Very unusual for a young man to have this extent of exertional dyspnea..  She is somewhat deconditioned, but the dyspnea is more than one would expect.  We will begin with 2D echocardiogram mostly because it seems like her sister may have had some type of cardiomyopathy.  Need to exclude cardiomyopathy first.  If this is normal, then would probably consider CPX for evaluation of etiology.  However if it is abnormal, by more aggressive ischemic evaluation would be warranted.      Relevant Orders   EKG 12-Lead   ECHOCARDIOGRAM COMPLETE   LONG TERM MONITOR (3-14 DAYS)    ===================================  HPI:    Rebecca York is a 41 y.o. female who is being seen today for the evaluation of DIZZINESS/HYPOTENSION at the request of Kotturi, Tyler Deis, MD.  Vilda Trotter was last seen on April 03, 2021 by Dr. Ileana Roup with complaints of dizziness.  Was checked in the office for orthostatic hypotension and was not orthostatic. => Given prescription for meclizine, recommended hydration check morning cortisol level and referred to cardiology.  There was concern because of low blood pressure.  Recent Hospitalizations: None  Reviewed  CV studies:    The following studies were reviewed today: (if available, images/films reviewed: From Epic Chart or Care Everywhere) None:   Interval History:  Rebecca York presents here today for evaluation of episodic dizziness near syncope as well as exertional dyspnea.  She says that she gets dizzy most the time when she is walking or standing up.  There is some component of it getting worse with standing up, but it is often when she is up and about.  She certainly gets dizzy if she turns her head or looks up or down.  Changes in position will make her dizzy.  She has not had any actual passout spells but has had some balance issues with the dizziness.  She has not  noted any rapid irregular heartbeats palpitations.  No chest pain or pressure with rest or exertion.  She does however get short of breath just walking around the house on some occasions.  It does not happen all the time, but it does limit her from doing a lot of activities that she would like to do because she gets short of breath for reasons that she just cannot explain.  She had previously been able to be pretty active.  CV Review of Symptoms (Summary) Cardiovascular ROS: positive for - dyspnea on exertion and lightheadedness/dizziness without presyncope negative for - chest pain, edema, irregular heartbeat, loss of consciousness, orthopnea, palpitations, paroxysmal nocturnal dyspnea, rapid heart rate, shortness of breath, or TIA/ amaurosis fugax, claudication  REVIEWED OF SYSTEMS   Review of Systems  Constitutional:  Positive for malaise/fatigue. Negative for weight loss.  HENT:  Negative for congestion and nosebleeds.   Respiratory:  Positive for shortness of breath (only exertional). Negative for cough and wheezing.   Cardiovascular:        Per HPI.   Gastrointestinal:  Negative for blood in stool and melena.  Genitourinary:  Negative for hematuria.  Musculoskeletal:  Negative for back pain, falls and joint pain.  Neurological:  Positive for dizziness and tingling (can feel tingling in arms & legs when lightheaded). Negative for focal weakness and weakness.  Psychiatric/Behavioral:  Negative for depression and memory loss. The patient is nervous/anxious. The patient does not have insomnia.    I have reviewed and (if needed) personally updated the patient's problem list, medications, allergies, past medical and surgical history, social and family history.   PAST MEDICAL HISTORY   Past Medical History:  Diagnosis Date   Asthma    Cancer ([place])    Throid   GAD (generalized anxiety disorder)    Recently switched from Zoloft to Lexapro   Hypothyroidism (acquired)    On Synthroid 125  mg daily    PAST SURGICAL HISTORY   Past Surgical History:  Procedure Laterality Date   DILATION AND CURETTAGE OF UTERUS     THYROIDECTOMY N/A 08/10/2020   Procedure: TOTAL THYROIDECTOMY;  Surgeon: Armandina Gemma, MD;  Location: [facility];  Service: General;  Laterality: N/A;   TUBAL LIGATION       There is no immunization history on file for this patient.  MEDICATIONS/ALLERGIES   Current Meds  Medication Sig   escitalopram (LEXAPRO) 20 MG tablet Take 20 mg by mouth daily.   levothyroxine (SYNTHROID) 125 MCG tablet Take 1 tablet by mouth daily.   meclizine (ANTIVERT) 12.5 MG tablet Take 12.5 mg by mouth 3 (three) times daily as needed.   sertraline (ZOLOFT) 100 MG tablet Take 100 mg by mouth daily.    Allergies  Allergen Reactions   Buspirone Other (See Comments)    headache    SOCIAL HISTORY/FAMILY HISTORY   Reviewed in Epic:   Social History  Tobacco Use   Smoking status: Never   Smokeless tobacco: Never  Vaping Use   Vaping Use: Never used  Substance Use Topics   Alcohol use: Never   Drug use: Never   Social History   Social History Narrative   Tries to be plan for walking outside and doing yard work.   Family History  Problem Relation Age of Onset   Thyroid disease Mother    Cancer Father    Arrhythmia Sister 54       Diagnosed with polymorphic VT, died from cardiac arrest prior to ICD placement.    OBJCTIVE -PE, EKG, labs   Wt Readings from Last 3 Encounters:  04/13/21 142 lb (64.4 kg)  12/06/20 138 lb 12.8 oz (63 kg)  11/01/20 140 lb 3.2 oz (63.6 kg)    Physical Exam: BP 102/70 (BP Location: Left Arm, Patient Position: Sitting)    Pulse 70    Ht '5\' 6"'  (1.676 m)    Wt 142 lb (64.4 kg)    SpO2 95%    BMI 22.92 kg/m  Physical Exam Vitals reviewed.  Constitutional:      General: She is not in acute distress.    Appearance: Normal appearance. She is normal weight. She is not ill-appearing, toxic-appearing or diaphoretic.     Comments: Well  nourished, well groomed  HENT:     Head: Normocephalic and atraumatic.  Neck:     Vascular: No carotid bruit or JVD.  Cardiovascular:     Rate and Rhythm: Normal rate and regular rhythm. No extrasystoles are present.    Chest Wall: PMI is not displaced.     Pulses: Normal pulses.     Heart sounds: Normal heart sounds, S1 normal and S2 normal. No murmur heard.   No friction rub. No gallop.     Comments: Possible midsystolic click. Pulmonary:     Effort: Pulmonary effort is normal. No respiratory distress.     Breath sounds: Normal breath sounds. No wheezing, rhonchi or rales.  Chest:     Chest wall: No tenderness.  Abdominal:     General: Abdomen is flat. Bowel sounds are normal. There is no distension.     Palpations: Abdomen is soft. There is no mass.     Tenderness: There is no abdominal tenderness.     Comments: No HSM or Bruit  Musculoskeletal:        General: No swelling. Normal range of motion.     Cervical back: Normal range of motion and neck supple.  Skin:    General: Skin is warm and dry.  Neurological:     General: No focal deficit present.     Mental Status: She is alert and oriented to person, place, and time.     Gait: Gait normal.  Psychiatric:        Mood and Affect: Mood normal.        Behavior: Behavior normal.        Thought Content: Thought content normal.        Judgment: Judgment normal.     Adult ECG Report  Rate: 70 ;  Rhythm: normal sinus rhythm and normal axis, intervals and durations. ;   Narrative Interpretation: Stable  Recent Labs: A.m. cortisol not drawn [place] to Comprehensive Metabolic Panel Component 37/90/24  05/12/20  05/08/20   Glucose 72 86 94  BUN '14 13 15  ' Creatinine 0.95 0.94 0.96  eGFR 77 79 --  BUN/Creatinine Ratio 15 14 16  Sodium 138 139 138  Potassium 4.1 4.0 3.9  Chloride 102 104 103  CO2 23 21 26.1  Calcium 8.9 8.8 8.4 Low   Total Protein 6.7 7.4 8.1 High   Albumin 4.1 4.3 4.1  Globulin, Total  2.6 3.1 --  A/G Ratio 1.6 1.4 --  Total Bilirubin 0.5 0.6 0.5  Alkaline Phosphatase 61 53 45 Low   AST '14 16 11 ' Low   ALT 12 13      03/06/21 10/12/20  TSH 0.945 1.190    Lab Results  Component Value Date   CHOL 132 05/12/2020   HDL 44 05/12/2020   LDLCALC 74 05/12/2020   TRIG 69 05/12/2020  ==================================================  COVID-19 Education: The signs and symptoms of COVID-19 were discussed with the patient and how to seek care for testing (follow up with PCP or arrange E-visit).    I spent a total of 31 minutes with the patient spent in direct patient consultation.  Additional time spent with chart review  / charting (studies, outside notes, etc): 24 min Total Time: 55 min  Current medicines are reviewed at length with the patient today.  (+/- concerns) n/a  This visit occurred during the SARS-CoV-2 public health emergency.  Safety protocols were in place, including screening questions prior to the visit, additional usage of staff PPE, and extensive cleaning of exam room while observing appropriate contact time as indicated for disinfecting solutions.  Notice: This dictation was prepared with Dragon dictation along with smart phrase technology. Any transcriptional errors that result from this process are unintentional and may not be corrected upon review.   Studies Ordered:  Orders Placed This Encounter  Procedures   LONG TERM MONITOR (3-14 DAYS)   EKG 12-Lead   ECHOCARDIOGRAM COMPLETE    Patient Instructions / Medication Changes & Studies & Tests Ordered   Patient Instructions  Medication Instructions:   No changes   *If you need a refill on your cardiac medications before your next appointment, please call your pharmacy*   Lab Work:  Not needed   Testing/Procedures: Will be schedule [place] has requested that you have an echocardiogram. Echocardiography is a painless test that uses sound waves  to create images of your heart. It provides your doctor with information about the size and shape of your heart and how well your hearts chambers and valves are working. This procedure takes approximately one hour. There are no restrictions for this procedure.  And  Your physician has recommended that you wear a holter monitor 7 days Zio . Holter monitors are medical devices that record the hearts electrical activity. Doctors most often use these monitors to diagnose arrhythmias. Arrhythmias are problems with the speed or rhythm of the heartbeat. The monitor is a small, portable device. You can wear one while you do your normal daily activities. This is usually used to diagnose what is causing palpitations/syncope (passing out).   Follow-Up: At [facility], you and your health needs are our priority.  As part of our continuing mission to provide you with exceptional heart care, we have created designated Provider Care Teams.  These Care Teams include your primary Cardiologist (physician) and Advanced Practice Providers (APPs -  Physician Assistants and Nurse Practitioners) who all work together to provide you with the care you need, when you need it.     Your next appointment:   3 month(s)  The format for your next appointment:   In Person  Provider:  Glenetta Hew ,MD   Other Instructions  Hydrate , hydrate, hydrate Liberal salt    Glenetta Hew, M.D., M.S. Interventional Cardiologist   Pager # [phone] Phone # [phone] [address] [place], [place] [address]   Thank you for choosing Heart[facility] at [facility]!!

## 2021-04-14 ENCOUNTER — Encounter: Payer: Self-pay | Admitting: Cardiology

## 2021-04-14 NOTE — Assessment & Plan Note (Signed)
Very unusual for a young man to have this extent of exertional dyspnea..  She is somewhat deconditioned, but the dyspnea is more than one would expect. ? ?We will begin with 2D echocardiogram mostly because it seems like her sister may have had some type of cardiomyopathy.  Need to exclude cardiomyopathy first.  If this is normal, then would probably consider CPX for evaluation of etiology.  However if it is abnormal, by more aggressive ischemic evaluation would be warranted. ?

## 2021-04-14 NOTE — Assessment & Plan Note (Signed)
Somewhat concerning past medical history with a sister having polymorphic VT.  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  There is no suggestion that she knew anything about any type of cardiomyopathy.  Her sister was actually about to be admitted for ICD placement when she had cardiac arrest. ? ?Plan:  ?? We will check 2D echocardiogram to exclude cardiomyopathy, and  ?? 7-day Zio Prevo monitor to exclude arrhythmia. ?

## 2021-04-14 NOTE — Assessment & Plan Note (Signed)
Seems chronic.  Not on any medications.  I think she just has low underlying blood pressure. ? ?Stressed the importance of adequate hydration: 70 to 80 ounces of water a day, liberalize salt intake. ?If she will be sitting or standing for long periods of time would consider support hose. ?

## 2021-04-14 NOTE — Assessment & Plan Note (Signed)
Dizziness sounds like it is vertiginous in nature, however could be exacerbated by hypotension.  She does not really sense any palpitations, however her sister had a history of polymorphic V. tach and therefore we need to exclude potential arrhythmias that are occult. ? ?For now we will start with a 7-day Zio Borsuk monitor since she is noticing dizziness just about every day. ?

## 2021-04-16 ENCOUNTER — Other Ambulatory Visit: Payer: Self-pay

## 2021-04-16 ENCOUNTER — Ambulatory Visit (HOSPITAL_COMMUNITY): Payer: Medicaid Other | Attending: Cardiology

## 2021-04-16 DIAGNOSIS — Z8249 Family history of ischemic heart disease and other diseases of the circulatory system: Secondary | ICD-10-CM | POA: Insufficient documentation

## 2021-04-16 DIAGNOSIS — R42 Dizziness and giddiness: Secondary | ICD-10-CM | POA: Diagnosis present

## 2021-04-16 DIAGNOSIS — I950 Idiopathic hypotension: Secondary | ICD-10-CM

## 2021-04-16 DIAGNOSIS — R0609 Other forms of dyspnea: Secondary | ICD-10-CM | POA: Insufficient documentation

## 2021-04-16 LAB — ECHOCARDIOGRAM COMPLETE
Area-P 1/2: 4.49 cm2
S' Lateral: 3.2 cm

## 2021-04-18 DIAGNOSIS — R42 Dizziness and giddiness: Secondary | ICD-10-CM | POA: Diagnosis not present

## 2021-04-18 DIAGNOSIS — I950 Idiopathic hypotension: Secondary | ICD-10-CM | POA: Diagnosis not present

## 2021-04-18 DIAGNOSIS — R0609 Other forms of dyspnea: Secondary | ICD-10-CM

## 2021-04-18 DIAGNOSIS — Z8249 Family history of ischemic heart disease and other diseases of the circulatory system: Secondary | ICD-10-CM

## 2021-06-06 ENCOUNTER — Ambulatory Visit: Payer: Medicaid Other | Admitting: "Endocrinology

## 2021-06-06 ENCOUNTER — Encounter: Payer: Self-pay | Admitting: "Endocrinology

## 2021-06-06 VITALS — BP 104/76 | HR 80 | Ht 66.0 in | Wt 145.6 lb

## 2021-06-06 DIAGNOSIS — C73 Malignant neoplasm of thyroid gland: Secondary | ICD-10-CM | POA: Diagnosis not present

## 2021-06-06 DIAGNOSIS — E890 Postprocedural hypothyroidism: Secondary | ICD-10-CM | POA: Diagnosis not present

## 2021-06-06 MED ORDER — LEVOTHYROXINE SODIUM 125 MCG PO TABS: 125.0000 ug | ORAL_TABLET | Freq: Every day | ORAL | 1 refills | Status: DC

## 2021-06-06 NOTE — Progress Notes (Signed)
? ? ?                                           06/06/2021, 5:16 PM ? ?Endocrinology follow-up note ? ? ?Subjective:  ? ? Patient ID: Rebecca York, female    DOB: 02-22-1980, PCP Kotturi, Tyler Deis, MD ? ? ?Past Medical History:  ?Diagnosis Date  ? Asthma   ? Cancer [facility])   ? Throid  ? GAD (generalized anxiety disorder)   ? Recently switched from Zoloft to Lexapro  ? Hypothyroidism (acquired)   ? On Synthroid 125 mg daily  ? ?Past Surgical History:  ?Procedure Laterality Date  ? DILATION AND CURETTAGE OF UTERUS    ? THYROIDECTOMY N/A 08/10/2020  ? Procedure: TOTAL THYROIDECTOMY;  Surgeon: Armandina Gemma, MD;  Location: [facility];  Service: General;  Laterality: N/A;  ? TUBAL LIGATION    ? ?Social History  ? ?Socioeconomic History  ? Marital status: Divorced  ?  Spouse name: Not on file  ? Number of children: Not on file  ? Years of education: Not on file  ? Highest education level: Not on file  ?Occupational History  ? Not on file  ?Tobacco Use  ? Smoking status: Never  ? Smokeless tobacco: Never  ?Vaping Use  ? Vaping Use: Never used  ?Substance and Sexual Activity  ? Alcohol use: Never  ? Drug use: Never  ? Sexual activity: Not on file  ?Other Topics Concern  ? Not on file  ?Social History Narrative  ? Tries to be plan for walking outside and doing yard work.  ? ?Social Determinants of Health  ? ?Financial Resource Strain: Not on file  ?Food Insecurity: Not on file  ?Transportation Needs: Not on file  ?Physical Activity: Not on file  ?Stress: Not on file  ?Social Connections: Not on file  ? ?Family History  ?Problem Relation Age of Onset  ? Thyroid disease Mother   ? Cancer Father   ? Arrhythmia Sister 57  ?     Diagnosed with polymorphic VT, died from cardiac arrest prior to ICD placement.  ? ?Outpatient Encounter Medications as of 06/06/2021  ?Medication Sig  ? escitalopram (LEXAPRO) 20 MG tablet Take 20 mg by mouth daily.  ? levothyroxine (SYNTHROID) 125 MCG tablet Take 1 tablet (125 mcg total) by mouth daily before  breakfast.  ? meclizine (ANTIVERT) 12.5 MG tablet Take 12.5 mg by mouth 3 (three) times daily as needed.  ? [DISCONTINUED] levothyroxine (SYNTHROID) 125 MCG tablet Take 1 tablet by mouth daily.  ? [DISCONTINUED] sertraline (ZOLOFT) 100 MG tablet Take 100 mg by mouth daily.  ? ?No facility-administered encounter medications on file as of 06/06/2021.  ? ?ALLERGIES: ?Allergies  ?Allergen Reactions  ? Buspirone Other (See Comments)  ?  headache  ? ? ?VACCINATION STATUS: ?There is no immunization history for the selected administration types on file for this patient. ? ?HPI ?Rebecca York is 41 y.o. 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  She is recovering very well from her surgery.   ?See notes from previous visits. ?After appropriate work-up confirmed thyroid malignancy she underwent total thyroidectomy by Dr. Harlow Asa on August 10, 2020.  Final microscopic diagnosis was multifocal papillary thyroid carcinoma bilateral, multifocal.  Margins not involved, chronic lymphocytic thyroiditis on the background.  4 lymph nodes negative  for metastasis. ?She is currently on levothyroxine 125 mcg p.o. daily before breakfast.  Her previsit thyroid function tests are consistent with appropriate replacement.  She is not on calcium supplements.   ?She has no new complaints today. ? ?She denies dysphagia, shortness of breath, nor voice change.  She does not have any family history of thyroid malignancy.  She has family history of various thyroid dysfunctions.  She denies palpitations, tremors. ? ? ?Review of Systems ? ?Constitutional: + Minimally fluctuating body weight,   no fatigue, no subjective hyperthermia, no subjective hypothermia  ? ? ?Objective:  ?  ? ?  06/06/2021  ? 10:32 AM 04/13/2021  ?  4:22 PM 12/06/2020  ? 10:08 AM  ?Vitals with BMI  ?Height '5\' 6"'$  '5\' 6"'$  '5\' 6"'$   ?Weight 145 lbs 10 oz 142 lbs 138 lbs 13 oz  ?BMI 23.51 22.93 22.41   ?Systolic 903 833 92  ?Diastolic 76 70 68  ?Pulse 80 70 72  ? ? ?BP 104/76   Pulse 80   Ht '5\' 6"'$  (1.676 m)   Wt 145 lb 9.6 oz (66 kg)   BMI 23.50 kg/m?   ?Wt Readings from Last 3 Encounters:  ?06/06/21 145 lb 9.6 oz (66 kg)  ?04/13/21 142 lb (64.4 kg)  ?12/06/20 138 lb 12.8 oz (63 kg)  ?  ?Physical Exam ? ?Constitutional:  Body mass index is 23.5 kg/m?.,  not in acute distress, normal state of mind ?Eyes: PERRLA, EOMI, no exophthalmos ?ENT: moist mucous membranes, + palpable nodular thyroid  enlarged.   ? ? ?CMP ( most recent) ?CMP  ?   ?Component Value Date/Time  ? NA 138 08/11/2020 0355  ? K 4.1 08/11/2020 0355  ? CL 106 08/11/2020 0355  ? CO2 25 08/11/2020 0355  ? GLUCOSE 119 (H) 08/11/2020 0355  ? BUN 9 08/11/2020 0355  ? BUN 13 05/12/2020 0000  ? CREATININE 0.79 08/11/2020 0355  ? CALCIUM 9.3 08/11/2020 0355  ? GFRNONAA >60 08/11/2020 0355  ? ? ? ?Diabetic Labs (most recent): ?Lab Results  ?Component Value Date  ? HGBA1C 5.1 05/12/2020  ? ? ? Lipid Panel ( most recent) ?Lipid Panel  ?   ?Component Value Date/Time  ? CHOL 132 05/12/2020 0000  ? TRIG 69 05/12/2020 0000  ? HDL 44 05/12/2020 0000  ? [place] 74 05/12/2020 0000  ? ?  ? ?Lab Results  ?Component Value Date  ? TSH 0.948 05/31/2021  ? TSH 197.586 (H) 11/10/2020  ? TSH 1.840 10/26/2020  ? TSH 7.43 (A) 05/12/2020  ? FREET4 1.68 05/31/2021  ? FREET4 1.57 10/26/2020  ?  ?June 02, 2020 thyroid ultrasound: ?Right lobe 5.2 cm with 2 nodules measuring 1 cm each.  Left lobe measured 4.7 cm with 1 nodule measuring 1.5 cm.  The left-sided nodule is felt to be suspicious and needs fine-needle aspiration biopsy. ?   ? ? ?Clinical History: 1.5 cm LUL  ?Specimen Submitted:  A. THYROID GLAND, LEFT LOBE, FINE NEEDLE  ?ASPIRATION:  ?FINAL MICROSCOPIC DIAGNOSIS:  ?- Suspicious for malignancy (Bethesda category V)  ? ?SPECIMEN ADEQUACY:  ?Satisfactory for evaluation  ? ?DIAGNOSTIC COMMENTS:  ?Suspicious for papillary carcinoma  ?Afirma testing is available upon  clinician request.  ? ?August 10, 2020 total thyroidectomy: ? ?FINAL MICROSCOPIC DIAGNOSIS:  ? ?A. THYROID, TOTAL THYROIDECTOMY:  ?- Multifocal papillary thyroid carcinoma.  ?- Margins not involved.  ?- Chronic lymphocytic thyroiditis (Hashimoto's).  ?- See oncology table.  ? ?B. LYMPH NODE, CENTRAL COMPARTMENT, EXCISION:  ?-  Four lymph nodes negative for metastatic carcinoma (0/4).  ? ? ?Thyrogen stimulated remnant ablation followed by posttherapy whole-body scan from September 10- November 20, 2020. ? ?FINDINGS: ?Small focus of tracer uptake at RIGHT thyroid bed consistent with ?thyroid remnant. ?  ?No abnormal sites of radio iodine accumulation are identified to ?suggest iodine avid metastatic thyroid cancer. ?  ?IMPRESSION: ?No scintigraphic evidence of iodine avid metastatic thyroid cancer.  ?Small RIGHT bed thyroid remnant. ?  ? ?Assessment & Plan:  ? ?1.  Papillary thyroid malignancy on the background of Hashimoto's thyroiditis  ?2.  Postsurgical hypothyroidism  ? ? ?I reviewed and discussed her recent labs with her.  She is status post surgery and RAI thyroid remnant ablation, offered in light of multifocal, bilateral, follicular variant component of papillary thyroid carcinoma, and relative youth of the patient and detectable thyroglobulin at 3.4. ? ?She is a status post adjuvant therapy with I-131 thyroid remnant ablation with whole-body scan on November 20, 2020.   ?Posttherapy scan showed small right thyroid bed remnant with no evidence of iodine avid metastatic thyroid cancer. ?She has detectable thyroglobulin, will need follow-up, with at least yearly imaging studies. ?She will have thyroid/neck ultrasound before her next visit. ? ?I discussed the expectations and the need for strict follow-up for surveillance afterwards. ? ? ?Regarding her postsurgical hypothyroidism: Her recent previsit thyroid function tests are consistent with appropriate replacement.  She is advised to continue levothyroxine 125 mcg  p.o. daily before breakfast.   ? ? - We discussed about the correct intake of her thyroid hormone, on empty stomach at fasting, with water, separated by at least 30 minutes from breakfast and other medications,  and sepa

## 2021-06-10 LAB — TSH: TSH: 0.948 u[IU]/mL (ref 0.450–4.500)

## 2021-06-10 LAB — T4, FREE: Free T4: 1.68 ng/dL (ref 0.82–1.77)

## 2021-06-10 LAB — THYROGLOBULIN LEVEL: Thyroglobulin (TG-RIA): 4.1 ng/mL

## 2021-08-01 ENCOUNTER — Ambulatory Visit: Payer: Medicaid Other | Admitting: Cardiology

## 2021-08-01 ENCOUNTER — Encounter: Payer: Self-pay | Admitting: Cardiology

## 2021-08-01 VITALS — BP 108/72 | HR 74 | Ht 66.0 in | Wt 147.4 lb

## 2021-08-01 DIAGNOSIS — Z8249 Family history of ischemic heart disease and other diseases of the circulatory system: Secondary | ICD-10-CM

## 2021-08-01 DIAGNOSIS — I950 Idiopathic hypotension: Secondary | ICD-10-CM | POA: Diagnosis not present

## 2021-08-01 DIAGNOSIS — R0609 Other forms of dyspnea: Secondary | ICD-10-CM | POA: Diagnosis not present

## 2021-08-01 NOTE — Patient Instructions (Signed)
Medication Instructions:  No changes     Lab Work: Not needed    Testing/Procedures: Not needed   Follow-Up: At [facility], you and your health needs are our priority.  As part of our continuing mission to provide you with exceptional heart care, we have created designated Provider Care Teams.  These Care Teams include your primary Cardiologist (physician) and Advanced Practice Providers (APPs -  Physician Assistants and Nurse Practitioners) who all work together to provide you with the care you need, when you need it.     Your next appointment:     as needed  The format for your next appointment:   In Person  Provider:   David Harding, MD  

## 2021-08-01 NOTE — Progress Notes (Signed)
Primary Care Provider: Wilburt Finlay, MD -[facility] [place] Cardiologist: Glenetta Hew, MD Electrophysiologist: None  Clinic Note: Chief Complaint  Patient presents with   Follow-up    Test results.  Only had 1 episode at work where she felt lightheaded and flushed.  Felt better after drinking and eating some chips.    ===================================  ASSESSMENT/PLAN   Problem List Items Addressed This Visit       Cardiology Problems   Hypotension arterial - Primary (Chronic)    Somewhat chronic.  Stable.  Continue aggressive hydration, and liberalize salt intake.        Other   Family history of ventricular tachycardia (Chronic)    Normal 2D echo and no evidence of a ventricular arrhythmias on monitor.  Continue to monitor symptoms but no need for EP evaluation at this point.      DOE (dyspnea on exertion) (Chronic)    X I think she is feeling a lot better.  I wonder some of her fatigue and dyspnea was related to low blood pressures and deconditioning.  She seems to doing a whole lot better now.  Since she is feeling okay and her echocardiogram was fine I do not think we need to do any ischemic evaluation at this point.  If symptoms recur, can consider CPX.      ===================================  HPI:    Rebecca York is a 41 y.o. female who is being seen today for follow-up evaluation of an episode of DIZZINESS/HYPOTENSION at the request of Kotturi, Tyler Deis, MD.  Rebecca York was referred by Dr. Ileana York after being seen on April 03, 2021 with complaints of dizziness.  Was checked in the office for orthostatic hypotension and was not orthostatic. => Given prescription for meclizine, recommended hydration check morning cortisol level and referred to cardiology.  There was concern because of low blood pressure.  She was seen on initial consultation in March 2023 for evaluation of this episode of dizziness and near syncope as well as  exertional dyspnea.  She noted getting dizzy most the time when she is walking or standing up.  Often occurring when she is up and about, not so much with standing.  She also gets dizzy when she turns her head or looks down, and changes position..  She had not had any passout spells but had some balance issues and dizziness.  No rapid irregular heartbeats or palpitations.  No other cardiac symptoms.  Maybe little bit of exertional dyspnea walking around the house on occasion.  She just gets short of breath a reason she cannot explain.  Had previously been very active but not able to do as much now.  She was concerned because her sister has a history of polymorphic VT.  No suggestion of cardiomyopathy-had prophylactic ICD placement.. Evaluated with 2D echo and Zio Dicke monitor. Dizziness thought potentially related to orthostasis with borderline low blood pressures at baseline.  Stressed importance of adequate hydration 70-80 ounces of water a day. Plan was to evaluate the results of echo and Zio Aragones monitor and then determine possibility of either CPX or more significant ischemic evaluation.  Recent Hospitalizations: None  Reviewed  CV studies:    The following studies were reviewed today: (if available, images/films reviewed: From Epic Chart or Care Everywhere) TTE 04/16/2021: EF 60 to 65%.  Wall motion.  Mild eccentric MR. Zio Hindle 04/2021: Sinus rhythm with a rate range of 51 to 136 bpm.  Average 74 bpm.  1 7 beat atrial run at a rate of 128 bpm.  Otherwise rare PACs with occasional isolated PVCs (1.2%).   Interval History:   Rebecca York presents here today overall doing pretty well.  She still has episodes at work where she gets dizzy and lightheaded when she gets hot.  She had an episode like this is earlier this week where she sat down and drank something and ate some chips and actually felt better.  She has not really had any more of the full dizzy near syncopal spells.  Not really noting any  tachycardic episodes.  She still has some exertional dyspnea but overall in general feels better.  She seems to be in better spirits today as well.  She does think that taking in consideration that she was not really drinking enough in the past, she is feeling better with better hydration.  CV Review of Symptoms (Summary) Cardiovascular ROS: positive for - dyspnea on exertion and lightheadedness/dizziness without presyncope => but overall better energy.  Feels better with more significant hydration. negative for - chest pain, edema, irregular heartbeat, loss of consciousness, orthopnea, palpitations, paroxysmal nocturnal dyspnea, rapid heart rate, shortness of breath, or TIA/ amaurosis fugax, claudication  REVIEWED OF SYSTEMS   Review of Systems  Constitutional:  Positive for malaise/fatigue. Negative for weight loss.  HENT:  Negative for nosebleeds.   Respiratory:  Positive for shortness of breath (only exertional, but better.).   Cardiovascular:        Per HPI.   Musculoskeletal:  Negative for falls.  Neurological:  Positive for dizziness and tingling (can feel tingling in arms & legs when lightheaded). Negative for focal weakness, loss of consciousness and weakness.  Psychiatric/Behavioral:  The patient is nervous/anxious.    I have reviewed and (if needed) personally updated the patient's problem list, medications, allergies, past medical and surgical history, social and family history.   PAST MEDICAL HISTORY   Past Medical History:  Diagnosis Date   Asthma    Cancer ([place])    Throid   GAD (generalized anxiety disorder)    Recently switched from Zoloft to Lexapro   Hypothyroidism (acquired)    On Synthroid 125 mg daily    PAST SURGICAL HISTORY   Past Surgical History:  Procedure Laterality Date   DILATION AND CURETTAGE OF UTERUS     THYROIDECTOMY N/A 08/10/2020   Procedure: TOTAL THYROIDECTOMY;  Surgeon: Armandina Gemma, MD;  Location: [facility];  Service: General;  Laterality:  N/A;   TUBAL LIGATION      There is no immunization history for the selected administration types on file for this patient.  MEDICATIONS/ALLERGIES   No outpatient medications have been marked as taking for the 08/01/21 encounter (Office Visit) with Leonie Man, MD.    Allergies  Allergen Reactions   Buspirone Other (See Comments)    headache    SOCIAL HISTORY/FAMILY HISTORY   Reviewed in Epic:   Social History   Tobacco Use   Smoking status: Never   Smokeless tobacco: Never  Vaping Use   Vaping Use: Never used  Substance Use Topics   Alcohol use: Never   Drug use: Never   Social History   Social History Narrative   Tries to be plan for walking outside and doing yard work.   Family History  Problem Relation Age of Onset   Thyroid disease Mother    Cancer Father    Arrhythmia Sister 65       Diagnosed with polymorphic VT, died  from cardiac arrest prior to ICD placement.    OBJCTIVE -PE, EKG, labs   Wt Readings from Last 3 Encounters:  08/01/21 147 lb 6.4 oz (66.9 kg)  06/06/21 145 lb 9.6 oz (66 kg)  04/13/21 142 lb (64.4 kg)    Physical Exam: BP 108/72   Pulse 74   Ht '5\' 6"'  (1.676 m)   Wt 147 lb 6.4 oz (66.9 kg)   SpO2 98%   BMI 23.79 kg/m  Physical Exam Vitals reviewed.  Constitutional:      General: She is not in acute distress.    Appearance: Normal appearance. She is normal weight. She is not ill-appearing, toxic-appearing or diaphoretic.     Comments: Well nourished, well groomed  HENT:     Head: Normocephalic and atraumatic.  Neck:     Vascular: No JVD.  Cardiovascular:     Rate and Rhythm: Normal rate and regular rhythm. No extrasystoles are present.    Chest Wall: PMI is not displaced.     Heart sounds: S1 normal and S2 normal.     Comments: Possible midsystolic click. Pulmonary:     Effort: Pulmonary effort is normal.  Abdominal:     Comments: No HSM or Bruit  Musculoskeletal:        General: No swelling. Normal range of  motion.  Skin:    General: Skin is warm and dry.  Neurological:     General: No focal deficit present.     Mental Status: She is alert and oriented to person, place, and time.     Gait: Gait normal.  Psychiatric:        Mood and Affect: Mood normal.        Behavior: Behavior normal.        Thought Content: Thought content normal.        Judgment: Judgment normal.     Adult ECG Report Not checked  Recent Labs: A.m. cortisol not drawn Lab Results: 05/31/2021  Component Value   TSH 0.948   Free T4 1.68      Chemistry   08/11/2020  Component Value   NA 138   K 4.1   CL 106   CO2 25   BUN 9   BUN 13   CREATININE 0.79   CALCIUM 9.3      [facility] Related to Comprehensive Metabolic Panel Component 25/42/70   Glucose 72  BUN 14  Creatinine 0.95  eGFR 77  BUN/Creatinine Ratio 15  Sodium 138  Potassium 4.1  Chloride 102  CO2 23  Calcium 8.9  Total Protein 6.7  Albumin 4.1  Globulin, Total 2.6  A/G Ratio 1.6  Total Bilirubin 0.5  Alkaline Phosphatase 61  AST 14  ALT 12     03/06/21 10/12/20  TSH 0.945 1.190    Lab Results  Component Value Date   CHOL 132 05/12/2020   HDL 44 05/12/2020   LDLCALC 74 05/12/2020   TRIG 69 05/12/2020  ==================================================  COVID-19 Education: The signs and symptoms of COVID-19 were discussed with the patient and how to seek care for testing (follow up with PCP or arrange E-visit).    I spent a total of 23 minutes with the patient spent in direct patient consultation.  Additional time spent with chart review  / charting (studies, outside notes, etc): 12 min Total Time: 35 min  Current medicines are reviewed at length with the patient today.  (+/- concerns) n/a  This visit occurred during the SARS-CoV-2 public  health emergency.  Safety protocols were in place, including screening questions prior to the visit, additional usage of staff PPE, and extensive cleaning of exam room while  observing appropriate contact time as indicated for disinfecting solutions.  Notice: This dictation was prepared with Dragon dictation along with smart phrase technology. Any transcriptional errors that result from this process are unintentional and may not be corrected upon review.   Studies Ordered:  No orders of the defined types were placed in this encounter.   Patient Instructions / Medication Changes & Studies & Tests Ordered   Patient Instructions  Medication Instructions:  No changes    Lab Work: Not needed    Testing/Procedures:  Not needed  Follow-Up: At [facility], you and your health needs are our priority.  As part of our continuing mission to provide you with exceptional heart care, we have created designated Provider Care Teams.  These Care Teams include your primary Cardiologist (physician) and Advanced Practice Providers (APPs -  Physician Assistants and Nurse Practitioners) who all work together to provide you with the care you need, when you need it.     Your next appointment:     as needed  The format for your next appointment:   In Person  Provider:   Glenetta Hew, MD        Glenetta Hew, M.D., M.S. Interventional Cardiologist   Pager # [phone] Phone # [phone] [address] [place], [place] [address]   Thank you for choosing [facility]!!

## 2021-08-18 ENCOUNTER — Encounter: Payer: Self-pay | Admitting: Cardiology

## 2021-08-18 NOTE — Assessment & Plan Note (Signed)
Somewhat chronic.  Stable.  Continue aggressive hydration, and liberalize salt intake.

## 2021-08-18 NOTE — Assessment & Plan Note (Signed)
Normal 2D echo and no evidence of a ventricular arrhythmias on monitor.  Continue to monitor symptoms but no need for EP evaluation at this point.

## 2021-08-18 NOTE — Assessment & Plan Note (Signed)
X I think she is feeling a lot better.  I wonder some of her fatigue and dyspnea was related to low blood pressures and deconditioning.  She seems to doing a whole lot better now.  Since she is feeling okay and her echocardiogram was fine I do not think we need to do any ischemic evaluation at this point.  If symptoms recur, can consider CPX.

## 2021-11-13 ENCOUNTER — Ambulatory Visit (HOSPITAL_COMMUNITY)
Admission: RE | Admit: 2021-11-13 | Discharge: 2021-11-13 | Disposition: A | Discharge status: Home / Self Care | Payer: Medicaid Other | Source: Ambulatory Visit | Attending: "Endocrinology | Admitting: "Endocrinology

## 2021-11-13 DIAGNOSIS — C73 Malignant neoplasm of thyroid gland: Secondary | ICD-10-CM | POA: Diagnosis present

## 2021-12-06 ENCOUNTER — Encounter: Payer: Self-pay | Admitting: "Endocrinology

## 2021-12-06 ENCOUNTER — Ambulatory Visit: Payer: Medicaid Other | Admitting: "Endocrinology

## 2021-12-06 VITALS — BP 90/58 | HR 68 | Ht 66.0 in | Wt 149.8 lb

## 2021-12-06 DIAGNOSIS — E890 Postprocedural hypothyroidism: Secondary | ICD-10-CM

## 2021-12-06 DIAGNOSIS — C73 Malignant neoplasm of thyroid gland: Secondary | ICD-10-CM | POA: Diagnosis not present

## 2021-12-06 LAB — COMPREHENSIVE METABOLIC PANEL
ALT: 13 IU/L (ref 0–32)
AST: 13 IU/L (ref 0–40)
Albumin/Globulin Ratio: 1.5 (ref 1.2–2.2)
Albumin: 4.3 g/dL (ref 3.9–4.9)
Alkaline Phosphatase: 64 IU/L (ref 44–121)
BUN/Creatinine Ratio: 15 (ref 9–23)
BUN: 12 mg/dL (ref 6–24)
Bilirubin Total: 0.4 mg/dL (ref 0.0–1.2)
CO2: 23 mmol/L (ref 20–29)
Calcium: 8.9 mg/dL (ref 8.7–10.2)
Chloride: 102 mmol/L (ref 96–106)
Creatinine, Ser: 0.82 mg/dL (ref 0.57–1.00)
Globulin, Total: 2.8 g/dL (ref 1.5–4.5)
Glucose: 87 mg/dL (ref 70–99)
Potassium: 4.1 mmol/L (ref 3.5–5.2)
Sodium: 139 mmol/L (ref 134–144)
Total Protein: 7.1 g/dL (ref 6.0–8.5)
eGFR: 92 mL/min/{1.73_m2} (ref >59)

## 2021-12-06 LAB — THYROGLOBULIN LEVEL: Thyroglobulin (TG-RIA): <2 ng/mL

## 2021-12-06 LAB — TSH: TSH: 7.11 u[IU]/mL — ABNORMAL HIGH (ref 0.450–4.500)

## 2021-12-06 LAB — T4, FREE: Free T4: 1.14 ng/dL (ref 0.82–1.77)

## 2021-12-06 MED ORDER — LEVOTHYROXINE SODIUM 125 MCG PO TABS: 125.0000 ug | ORAL_TABLET | Freq: Every day | ORAL | 1 refills | Status: DC

## 2021-12-06 NOTE — Progress Notes (Signed)
12/06/2021, 5:45 PM  Endocrinology follow-up note   Subjective:    Patient ID: Rebecca York, female    DOB: 07/18/1980, PCP Kotturi, Tyler Deis, MD   Past Medical History:  Diagnosis Date   Asthma    Cancer ([place])    Throid   GAD (generalized anxiety disorder)    Recently switched from Zoloft to Lexapro   Hypothyroidism (acquired)    On Synthroid 125 mg daily   Past Surgical History:  Procedure Laterality Date   DILATION AND CURETTAGE OF UTERUS     THYROIDECTOMY N/A 08/10/2020   Procedure: TOTAL THYROIDECTOMY;  Surgeon: Armandina Gemma, MD;  Location: [facility];  Service: General;  Laterality: N/A;   TUBAL LIGATION     Social History   Socioeconomic History   Marital status: Divorced    Spouse name: Not on file   Number of children: Not on file   Years of education: Not on file   Highest education level: Not on file  Occupational History   Not on file  Tobacco Use   Smoking status: Never   Smokeless tobacco: Never  Vaping Use   Vaping Use: Never used  Substance and Sexual Activity   Alcohol use: Never   Drug use: Never   Sexual activity: Not on file  Other Topics Concern   Not on file  Social History Narrative   Tries to be plan for walking outside and doing yard work.   Social Determinants of Health   Financial Resource Strain: Not on file  Food Insecurity: Not on file  Transportation Needs: Not on file  Physical Activity: Not on file  Stress: Not on file  Social Connections: Not on file   Family History  Problem Relation Age of Onset   Thyroid disease Mother    Cancer Father    Arrhythmia Sister 30       Diagnosed with polymorphic VT, died from cardiac arrest prior to ICD placement.   Outpatient Encounter Medications as of 12/06/2021  Medication Sig   escitalopram (LEXAPRO) 20 MG tablet Take 20 mg by mouth daily.   levothyroxine (SYNTHROID) 125 MCG tablet Take 1 tablet (125 mcg total) by mouth daily before  breakfast.   meclizine (ANTIVERT) 12.5 MG tablet Take 12.5 mg by mouth 3 (three) times daily as needed.   [DISCONTINUED] levothyroxine (SYNTHROID) 125 MCG tablet Take 1 tablet (125 mcg total) by mouth daily before breakfast.   No facility-administered encounter medications on file as of 12/06/2021.   ALLERGIES: Allergies  Allergen Reactions   Buspirone Other (See Comments)    headache    VACCINATION STATUS: Immunization History  Administered Date(s) Administered   PFIZER(Purple Top)SARS-COV-2 Vaccination 09/18/2019    HPI Rebecca York is 41 y.o. 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  She is recovering very well from her surgery.   See notes from previous visits. After appropriate work-up confirmed thyroid malignancy she underwent total thyroidectomy by Dr. Harlow Asa on August 10, 2020.  Final microscopic diagnosis was multifocal papillary thyroid carcinoma bilateral, multifocal.  Margins not involved, chronic lymphocytic thyroiditis on the background.  4 lymph nodes negative for metastasis. She is currently on levothyroxine 125 mcg p.o.  daily before breakfast.  Her previsit thyroid function tests are consistent with slight under replacement.  However patient reports some inconsistency taking her thyroid hormone.  She brings up some fatigue.   She is not on calcium supplements.     She denies dysphagia, shortness of breath, nor voice change.  She does not have any family history of thyroid malignancy.  She has family history of various thyroid dysfunctions.  She denies palpitations, tremors.   Review of Systems  Constitutional: + Minimally fluctuating body weight,   no fatigue, no subjective hyperthermia, no subjective hypothermia    Objective:       12/06/2021   10:00 AM 08/01/2021    2:50 PM 06/06/2021   10:32 AM  Vitals with BMI  Height '5\' 6"'$  '5\' 6"'$  '5\' 6"'$   Weight 149 lbs 13 oz 147 lbs  6 oz 145 lbs 10 oz  BMI 24.19 61.6 07.37  Systolic 90 106 269  Diastolic 58 72 76  Pulse 68 74 80    BP (!) 90/58   Pulse 68   Ht '5\' 6"'$  (1.676 m)   Wt 149 lb 12.8 oz (67.9 kg)   BMI 24.18 kg/m   Wt Readings from Last 3 Encounters:  12/06/21 149 lb 12.8 oz (67.9 kg)  08/01/21 147 lb 6.4 oz (66.9 kg)  06/06/21 145 lb 9.6 oz (66 kg)    Physical Exam  Constitutional:  Body mass index is 24.18 kg/m.,  not in acute distress, normal state of mind Eyes: PERRLA, EOMI, no exophthalmos ENT: moist mucous membranes, + palpable nodular thyroid  enlarged.     CMP ( most recent) CMP     Component Value Date/Time   NA 139 11/27/2021 0957   K 4.1 11/27/2021 0957   CL 102 11/27/2021 0957   CO2 23 11/27/2021 0957   GLUCOSE 87 11/27/2021 0957   GLUCOSE 119 (H) 08/11/2020 0355   BUN 12 11/27/2021 0957   CREATININE 0.82 11/27/2021 0957   CALCIUM 8.9 11/27/2021 0957   PROT 7.1 11/27/2021 0957   ALBUMIN 4.3 11/27/2021 0957   AST 13 11/27/2021 0957   ALT 13 11/27/2021 0957   ALKPHOS 64 11/27/2021 0957   BILITOT 0.4 11/27/2021 0957   GFRNONAA >60 08/11/2020 0355     Diabetic Labs (most recent): Lab Results  Component Value Date   HGBA1C 5.1 05/12/2020     Lipid Panel ( most recent) Lipid Panel     Component Value Date/Time   CHOL 132 05/12/2020 0000   TRIG 69 05/12/2020 0000   HDL 44 05/12/2020 0000   LDLCALC 74 05/12/2020 0000      Lab Results  Component Value Date   TSH 7.110 (H) 11/27/2021   TSH 0.948 05/31/2021   TSH 197.586 (H) 11/10/2020   TSH 1.840 10/26/2020   TSH 7.43 (A) 05/12/2020   FREET4 1.14 11/27/2021   FREET4 1.68 05/31/2021   FREET4 1.57 10/26/2020    June 02, 2020 thyroid ultrasound: Right lobe 5.2 cm with 2 nodules measuring 1 cm each.  Left lobe measured 4.7 cm with 1 nodule measuring 1.5 cm.  The left-sided nodule is felt to be suspicious and needs fine-needle aspiration biopsy.      Clinical History: 1.5 cm LUL  Specimen Submitted:  A.  THYROID GLAND, LEFT LOBE, FINE NEEDLE  ASPIRATION:  FINAL MICROSCOPIC DIAGNOSIS:  - Suspicious for malignancy (Bethesda category V)   SPECIMEN ADEQUACY:  Satisfactory for evaluation   DIAGNOSTIC COMMENTS:  Suspicious for papillary carcinoma  Afirma testing is available upon clinician request.   August 10, 2020 total thyroidectomy:  FINAL MICROSCOPIC DIAGNOSIS:   A. THYROID, TOTAL THYROIDECTOMY:  - Multifocal papillary thyroid carcinoma.  - Margins not involved.  - Chronic lymphocytic thyroiditis (Hashimoto's).  - See oncology table.   B. LYMPH NODE, CENTRAL COMPARTMENT, EXCISION:  - Four lymph nodes negative for metastatic carcinoma (0/4).    Thyrogen stimulated remnant ablation followed by posttherapy whole-body scan from September 10- November 20, 2020.  FINDINGS: Small focus of tracer uptake at RIGHT thyroid bed consistent with thyroid remnant.   No abnormal sites of radio iodine accumulation are identified to suggest iodine avid metastatic thyroid cancer.   IMPRESSION: No scintigraphic evidence of iodine avid metastatic thyroid cancer.  Small RIGHT bed thyroid remnant.    Thyroid/neck ultrasound on November 13, 2021: FINDINGS: Surgical changes of total thyroidectomy.  Unremarkable appearance of the surgical thyroid bed.   _________________________________________________________   No adenopathy   IMPRESSION: Ultrasound survey demonstrates surgical changes of total thyroidectomy, with unremarkable appearance of the surgical thyroid bed.   Assessment & Plan:   1.  Papillary thyroid malignancy on the background of Hashimoto's thyroiditis  2.  Postsurgical hypothyroidism    I reviewed and discussed her recent labs with her.  She is status post surgery and RAI thyroid remnant ablation, offered in light of multifocal, bilateral, follicular variant component of papillary thyroid carcinoma, and relative youth of the patient and detectable thyroglobulin at  3.4.  She is a status post adjuvant therapy with I-131 thyroid remnant ablation with whole-body scan on November 20, 2020.   Posttherapy scan showed small right thyroid bed remnant with no evidence of iodine avid metastatic thyroid cancer. She has detectable thyroglobulin, will need follow-up, with at least yearly imaging studies. Her previsit thyroid/neck ultrasound for surveillance was negative.    I discussed the expectations and the need for strict follow-up for surveillance afterwards.   Regarding her postsurgical hypothyroidism: Her recent previsit thyroid function tests are consistent with slight under replacement.  However this is a result of her inconsistency taking her levothyroxine.  I emphasized the need for levothyroxine 125 mcg p.o. daily before breakfast.    - We discussed about the correct intake of her thyroid hormone, on empty stomach at fasting, with water, separated by at least 30 minutes from breakfast and other medications,  and separated by more than 4 hours from calcium, iron, multivitamins, acid reflux medications (PPIs). -Patient is made aware of the fact that thyroid hormone replacement is needed for life, dose to be adjusted by periodic monitoring of thyroid function tests.  She has a steady weight currently.     She is advised to update her screening studies including Pap smear.  She is scheduled to see her OB/GYN provider in the near future.     - she is advised to maintain close follow up with Kotturi, Tyler Deis, MD for primary care needs.   I spent 22 minutes in the care of the patient today including review of labs from Thyroid Function, CMP, and other relevant labs ; imaging/biopsy records (current and previous including abstractions from other facilities); face-to-face time discussing  her lab results and symptoms, medications doses, her options of short and long term treatment based on the latest standards of care / guidelines;   and documenting the  encounter.  Rebecca York  participated in the discussions, expressed understanding, and voiced agreement with the above plans.  All questions were answered to her satisfaction. she  is encouraged to contact clinic should she have any questions or concerns prior to her return visit.    Follow up plan: Return in about 3 months (around 03/08/2022) for F/U with Pre-visit Labs.   Glade Lloyd, MD [facility] [address] [place], [place] [address] Phone: [phone]  Fax: [phone]     12/06/2021, 5:45 PM  This note was partially dictated with voice recognition software. Similar sounding words can be transcribed inadequately or may not  be corrected upon review.

## 2022-03-11 ENCOUNTER — Encounter: Payer: Self-pay | Admitting: "Endocrinology

## 2022-03-11 ENCOUNTER — Ambulatory Visit: Payer: Medicaid Other | Admitting: "Endocrinology

## 2022-03-11 VITALS — BP 88/64 | HR 76 | Ht 66.0 in | Wt 148.0 lb

## 2022-03-11 DIAGNOSIS — C73 Malignant neoplasm of thyroid gland: Secondary | ICD-10-CM

## 2022-03-11 DIAGNOSIS — E890 Postprocedural hypothyroidism: Secondary | ICD-10-CM

## 2022-03-11 MED ORDER — LEVOTHYROXINE SODIUM 112 MCG PO TABS: 112.0000 ug | ORAL_TABLET | Freq: Every day | ORAL | 1 refills | Status: AC

## 2022-03-11 NOTE — Progress Notes (Signed)
03/11/2022, 1:01 PM  Endocrinology follow-up note   Subjective:    Patient ID: Rebecca York, female    DOB: [birth year], PCP Kotturi, Tyler Deis, MD   Past Medical History:  Diagnosis Date   Asthma    Cancer ([place])    Throid   GAD (generalized anxiety disorder)    Recently switched from Zoloft to Lexapro   Hypothyroidism (acquired)    On Synthroid 125 mg daily   Past Surgical History:  Procedure Laterality Date   DILATION AND CURETTAGE OF UTERUS     THYROIDECTOMY N/A 08/10/2020   Procedure: TOTAL THYROIDECTOMY;  Surgeon: Armandina Gemma, MD;  Location: [facility];  Service: General;  Laterality: N/A;   TUBAL LIGATION     Social History   Socioeconomic History   Marital status: Divorced    Spouse name: Not on file   Number of children: Not on file   Years of education: Not on file   Highest education level: Not on file  Occupational History   Not on file  Tobacco Use   Smoking status: Never   Smokeless tobacco: Never  Vaping Use   Vaping Use: Never used  Substance and Sexual Activity   Alcohol use: Never   Drug use: Never   Sexual activity: Not on file  Other Topics Concern   Not on file  Social History Narrative   Tries to be plan for walking outside and doing yard work.   Social Determinants of Health   Financial Resource Strain: Not on file  Food Insecurity: Not on file  Transportation Needs: Not on file  Physical Activity: Not on file  Stress: Not on file  Social Connections: Not on file   Family History  Problem Relation Age of Onset   Thyroid disease Mother    Cancer Father    Arrhythmia Sister 35       Diagnosed with polymorphic VT, died from cardiac arrest prior to ICD placement.   Outpatient Encounter Medications as of 03/11/2022  Medication Sig   escitalopram (LEXAPRO) 20 MG tablet Take 20 mg by mouth daily.   levothyroxine (SYNTHROID) 112 MCG tablet Take 1 tablet (112 mcg total) by mouth daily before  breakfast.   meclizine (ANTIVERT) 12.5 MG tablet Take 12.5 mg by mouth 3 (three) times daily as needed.   [DISCONTINUED] levothyroxine (SYNTHROID) 125 MCG tablet Take 1 tablet (125 mcg total) by mouth daily before breakfast.   No facility-administered encounter medications on file as of 03/11/2022.   ALLERGIES: Allergies  Allergen Reactions   Buspirone Other (See Comments)    headache    VACCINATION STATUS: Immunization History  Administered Date(s) Administered   PFIZER(Purple Top)SARS-COV-2 Vaccination 09/18/2019    HPI Rebecca York is 42 y.o. 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  She is recovering very well from her surgery.   See notes from previous visits. After appropriate work-up confirmed thyroid malignancy she underwent total thyroidectomy by Dr. Harlow Asa on August 10, 2020.  Final microscopic diagnosis was multifocal papillary thyroid carcinoma bilateral, multifocal.  Margins not involved, chronic lymphocytic thyroiditis on the background.  4 lymph nodes negative for metastasis. She is currently on levothyroxine 125 mcg p.o.  daily before breakfast.  Her previsit thyroid function tests are consistent with slight over-replacement.  Patient reports some intermittent palpitations, tremors and hot flashes.  She has been more consistent taking her levothyroxine since her last visit.    She is not on calcium supplements.     She denies dysphagia, shortness of breath, nor voice change.  She does not have any family history of thyroid malignancy.  She has family history of various thyroid dysfunctions.  She denies palpitations, tremors.   Review of Systems  Constitutional: + Minimally fluctuating body weight,   no fatigue, no subjective hyperthermia, no subjective hypothermia    Objective:       03/11/2022   10:44 AM 12/06/2021   10:00 AM 08/01/2021    2:50 PM  Vitals with BMI  Height  '5\' 6"'$  '5\' 6"'$  '5\' 6"'$   Weight 148 lbs 149 lbs 13 oz 147 lbs 6 oz  BMI 23.9 30.86 57.8  Systolic 88 90 469  Diastolic 64 58 72  Pulse 76 68 74    BP (!) 88/64   Pulse 76   Ht '5\' 6"'$  (1.676 m)   Wt 148 lb (67.1 kg)   BMI 23.89 kg/m   Wt Readings from Last 3 Encounters:  03/11/22 148 lb (67.1 kg)  12/06/21 149 lb 12.8 oz (67.9 kg)  08/01/21 147 lb 6.4 oz (66.9 kg)    Physical Exam  Constitutional:  Body mass index is 23.89 kg/m.,  not in acute distress, normal state of mind Eyes: PERRLA, EOMI, no exophthalmos ENT: moist mucous membranes, + palpable nodular thyroid  enlarged.     CMP ( most recent) CMP     Component Value Date/Time   NA 139 11/27/2021 0957   K 4.1 11/27/2021 0957   CL 102 11/27/2021 0957   CO2 23 11/27/2021 0957   GLUCOSE 87 11/27/2021 0957   GLUCOSE 119 (H) 08/11/2020 0355   BUN 12 11/27/2021 0957   CREATININE 0.82 11/27/2021 0957   CALCIUM 8.9 11/27/2021 0957   PROT 7.1 11/27/2021 0957   ALBUMIN 4.3 11/27/2021 0957   AST 13 11/27/2021 0957   ALT 13 11/27/2021 0957   ALKPHOS 64 11/27/2021 0957   BILITOT 0.4 11/27/2021 0957   GFRNONAA >60 08/11/2020 0355     Diabetic Labs (most recent): Lab Results  Component Value Date   HGBA1C 5.1 05/12/2020     Lipid Panel ( most recent) Lipid Panel     Component Value Date/Time   CHOL 132 05/12/2020 0000   TRIG 69 05/12/2020 0000   HDL 44 05/12/2020 0000   LDLCALC 74 05/12/2020 0000      Lab Results  Component Value Date   TSH 0.297 (L) 03/05/2022   TSH 7.110 (H) 11/27/2021   TSH 0.948 05/31/2021   TSH 197.586 (H) 11/10/2020   TSH 1.840 10/26/2020   TSH 7.43 (A) 05/12/2020   FREET4 1.64 03/05/2022   FREET4 1.14 11/27/2021   FREET4 1.68 05/31/2021   FREET4 1.57 10/26/2020    June 02, 2020 thyroid ultrasound: Right lobe 5.2 cm with 2 nodules measuring 1 cm each.  Left lobe measured 4.7 cm with 1 nodule measuring 1.5 cm.  The left-sided nodule is felt to be suspicious and needs fine-needle  aspiration biopsy.      Clinical History: 1.5 cm LUL  Specimen Submitted:  A. THYROID GLAND, LEFT LOBE, FINE NEEDLE  ASPIRATION:  FINAL MICROSCOPIC DIAGNOSIS:  - Suspicious for malignancy (Bethesda category V)   SPECIMEN ADEQUACY:  Satisfactory  for evaluation   DIAGNOSTIC COMMENTS:  Suspicious for papillary carcinoma  Afirma testing is available upon clinician request.   August 10, 2020 total thyroidectomy:  FINAL MICROSCOPIC DIAGNOSIS:   A. THYROID, TOTAL THYROIDECTOMY:  - Multifocal papillary thyroid carcinoma.  - Margins not involved.  - Chronic lymphocytic thyroiditis (Hashimoto's).  - See oncology table.   B. LYMPH NODE, CENTRAL COMPARTMENT, EXCISION:  - Four lymph nodes negative for metastatic carcinoma (0/4).    Thyrogen stimulated remnant ablation followed by posttherapy whole-body scan from September 10- November 20, 2020.  FINDINGS: Small focus of tracer uptake at RIGHT thyroid bed consistent with thyroid remnant.   No abnormal sites of radio iodine accumulation are identified to suggest iodine avid metastatic thyroid cancer.   IMPRESSION: No scintigraphic evidence of iodine avid metastatic thyroid cancer.  Small RIGHT bed thyroid remnant.    Thyroid/neck ultrasound on November 13, 2021: FINDINGS: Surgical changes of total thyroidectomy.  Unremarkable appearance of the surgical thyroid bed.   _________________________________________________________   No adenopathy   IMPRESSION: Ultrasound survey demonstrates surgical changes of total thyroidectomy, with unremarkable appearance of the surgical thyroid bed.   Assessment & Plan:   1.  Papillary thyroid malignancy on the background of Hashimoto's thyroiditis  2.  Postsurgical hypothyroidism    I reviewed and discussed her recent labs with her.  She is status post surgery and RAI thyroid remnant ablation, offered in light of multifocal, bilateral, follicular variant component of papillary thyroid  carcinoma, and relative youth of the patient and detectable thyroglobulin at 3.4.  She is a status post adjuvant therapy with I-131 thyroid remnant ablation with whole-body scan on November 20, 2020.   Posttherapy scan showed small right thyroid bed remnant with no evidence of iodine avid metastatic thyroid cancer. She has detectable thyroglobulin, will need follow-up, with at least yearly imaging studies. Her previsit thyroid/neck ultrasound for surveillance was negative.    She will be considered for Thyrogen stimulated whole-body scan next year.   Regarding her postsurgical hypothyroidism: Her recent previsit thyroid function tests are consistent with slight over-replacement.  Due to her clinical symptoms, she is advised to lower her levothyroxine to 112 mcg p.o. daily before breakfast.    - We discussed about the correct intake of her thyroid hormone, on empty stomach at fasting, with water, separated by at least 30 minutes from breakfast and other medications,  and separated by more than 4 hours from calcium, iron, multivitamins, acid reflux medications (PPIs). -Patient is made aware of the fact that thyroid hormone replacement is needed for life, dose to be adjusted by periodic monitoring of thyroid function tests.  She has a steady weight currently.     She is advised to update her screening studies including Pap smear.  She is scheduled to see her OB/GYN provider in the near future.     - she is advised to maintain close follow up with Kotturi, Tyler Deis, MD for primary care needs.   I spent 21 minutes in the care of the patient today including review of labs from Thyroid Function, CMP, and other relevant labs ; imaging/biopsy records (current and previous including abstractions from other facilities); face-to-face time discussing  her lab results and symptoms, medications doses, her options of short and long term treatment based on the latest standards of care / guidelines;   and  documenting the encounter.  Jinna Mullan  participated in the discussions, expressed understanding, and voiced agreement with the above plans.  All questions were answered  to her satisfaction. she is encouraged to contact clinic should she have any questions or concerns prior to her return visit.   Follow up plan: Return in about 6 months (around 09/09/2022) for F/U with Pre-visit Labs.   Glade Lloyd, MD [facility] [address] [place], [place] [address] Phone: [phone]  Fax: [phone]     03/11/2022, 1:01 PM  This note was partially dictated with voice recognition software. Similar sounding words can be transcribed inadequately or may not  be corrected upon review.

## 2022-03-12 LAB — THYROGLOBULIN LEVEL: Thyroglobulin (TG-RIA): <2 ng/mL

## 2022-03-12 LAB — TSH: TSH: 0.297 u[IU]/mL — ABNORMAL LOW (ref 0.450–4.500)

## 2022-03-12 LAB — T4, FREE: Free T4: 1.64 ng/dL (ref 0.82–1.77)

## 2022-05-25 IMAGING — US US FNA BIOPSY THYROID 1ST LESION
1 series · 13 of 19 positions shown · non-contrast
Comparison: US Thyroid 06/02/20

MEDICATIONS:
8 cc 1% lidocaine

COMPLICATIONS:
None immediate.

INDICATION: Indeterminate thyroid nodule

Left superior thyroid nodule
1.5 cm
EXAM:
ULTRASOUND GUIDED FINE NEEDLE ASPIRATION OF INDETERMINATE THYROID
NODULE
TECHNIQUE: Informed written consent was obtained from the patient after a
discussion of the risks, benefits and alternatives to treatment.
Questions regarding the procedure were encouraged and answered. A
timeout was performed prior to the initiation of the procedure.

[Series 1: us fna bx thyroid 1st lesion afirma · 19 acquisitions, 13 frames shown]
[im 1/19]
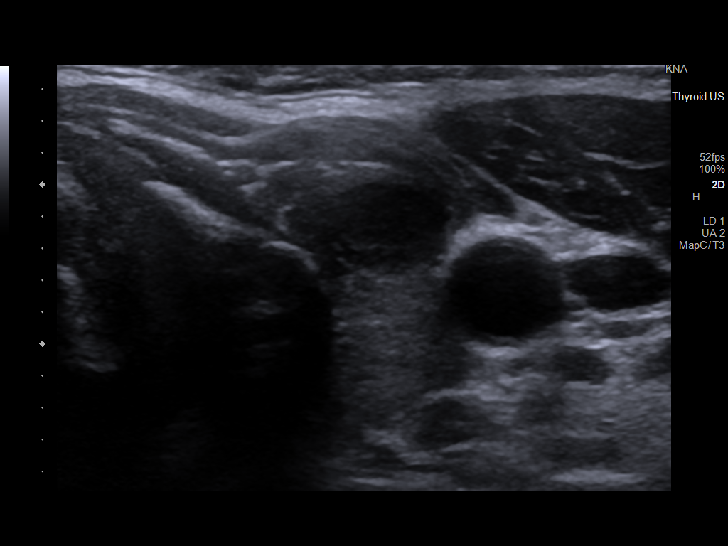
[im 3/19]
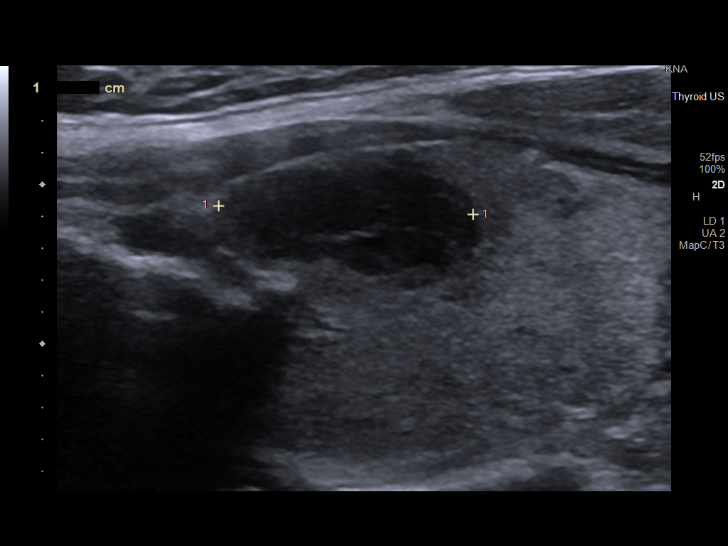
[im 4/19]
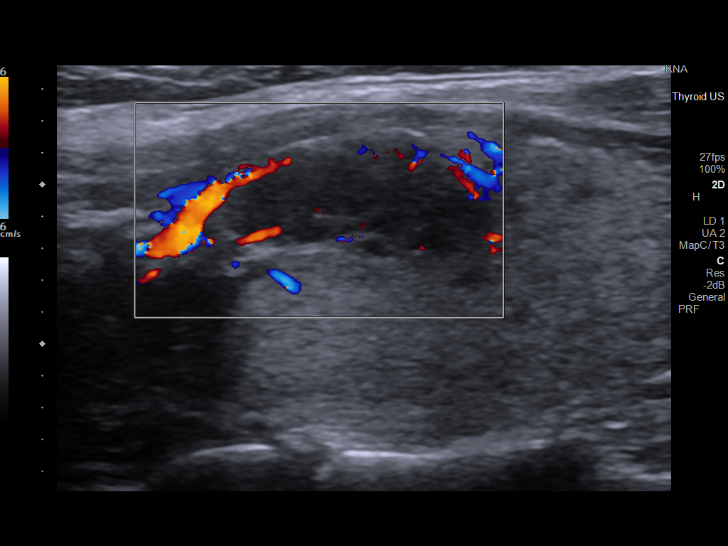
[im 6/19]
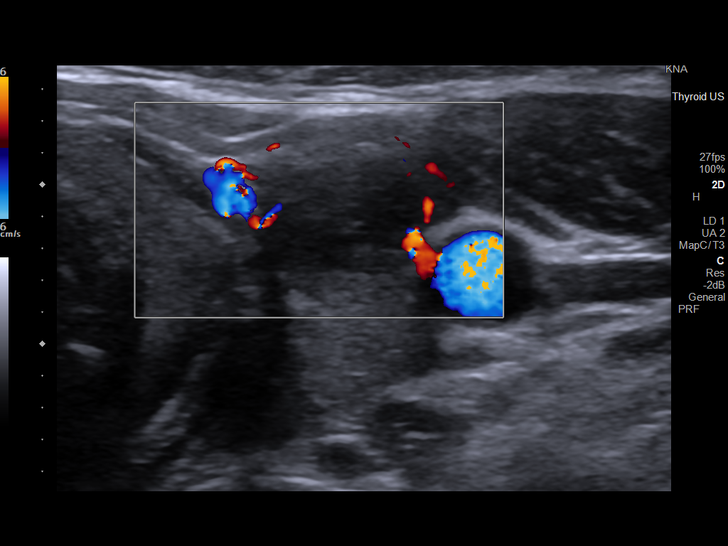
[im 7/19]
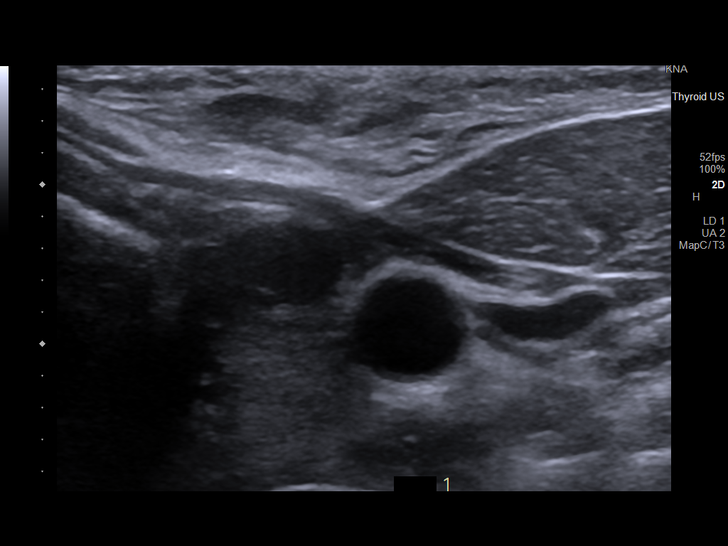
[im 9/19]
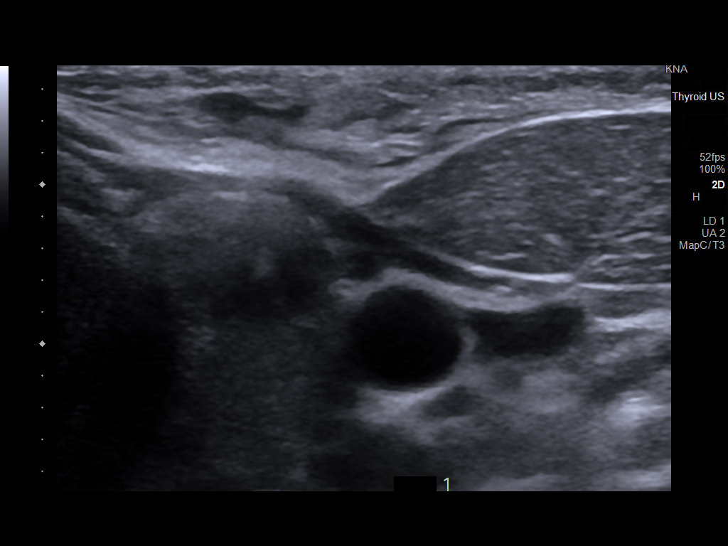
[im 10/19]
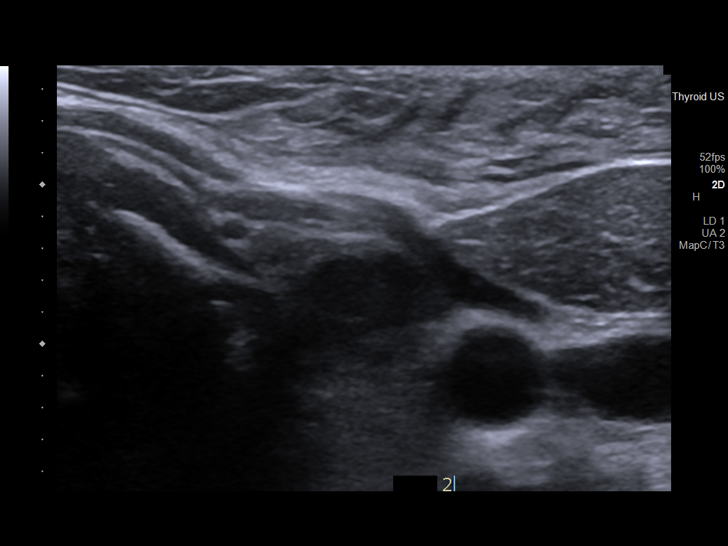
[im 11/19]
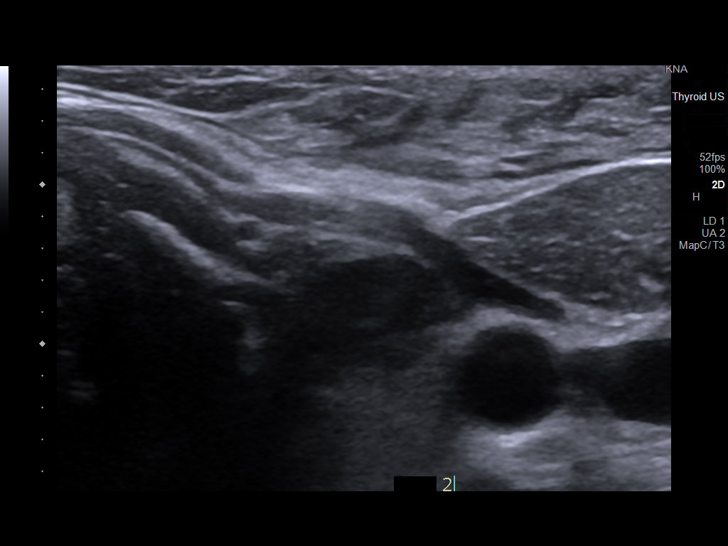
[im 13/19]
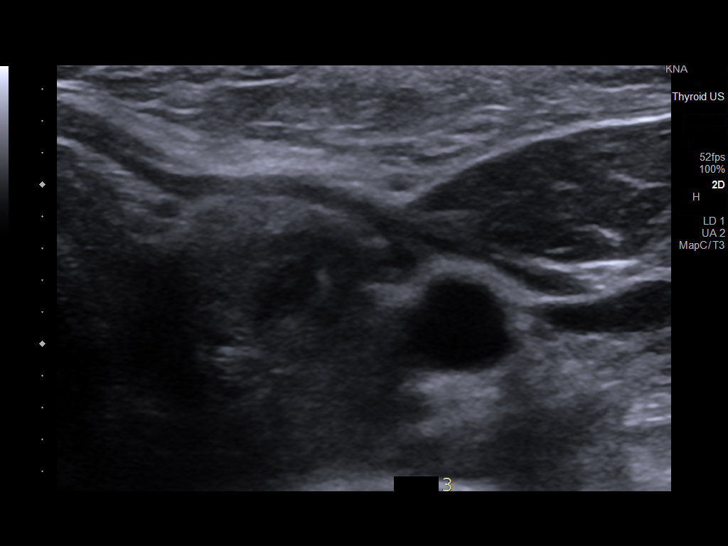
[im 14/19]
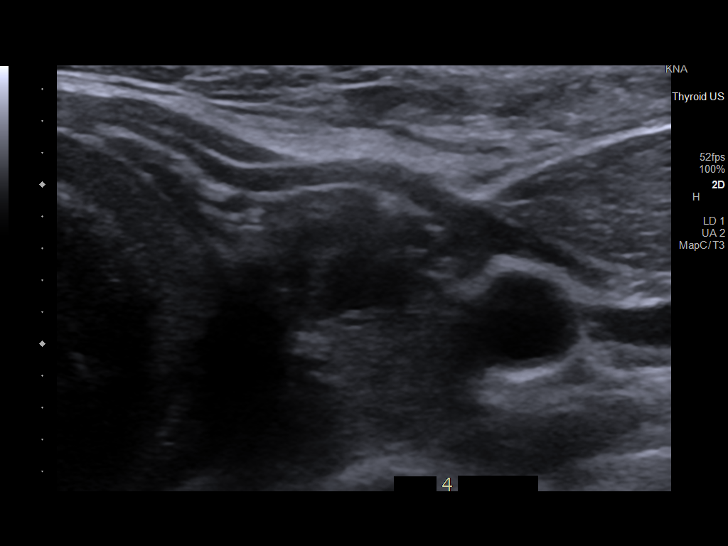
[im 16/19]
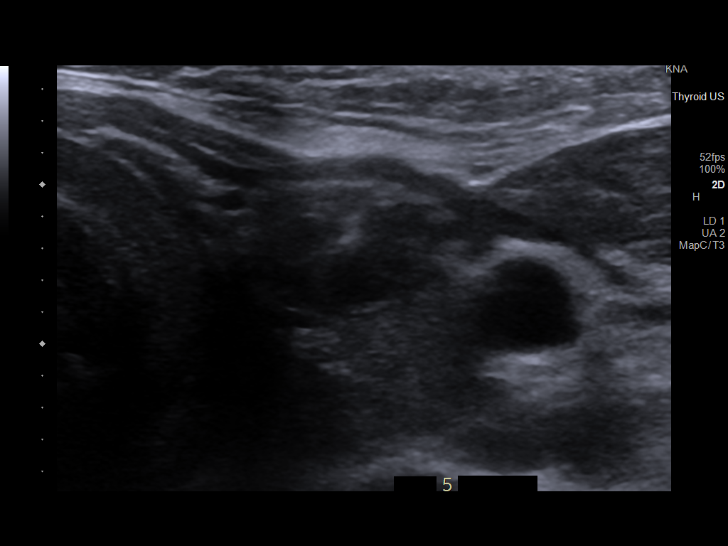
[im 17/19]
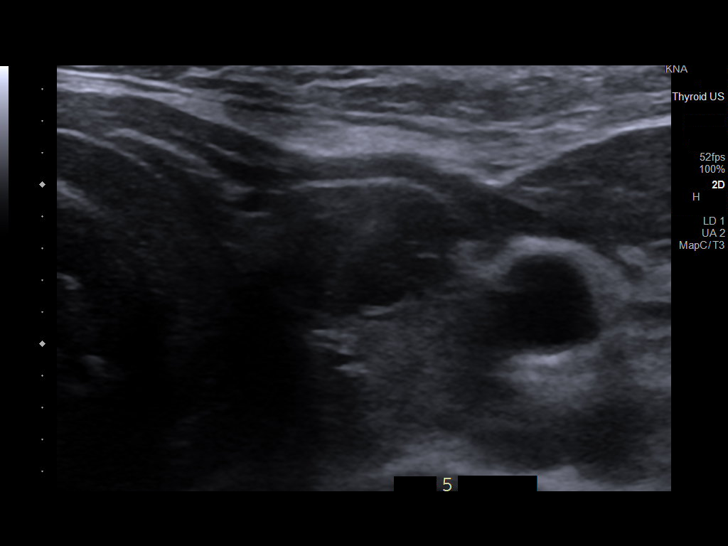
[im 19/19]
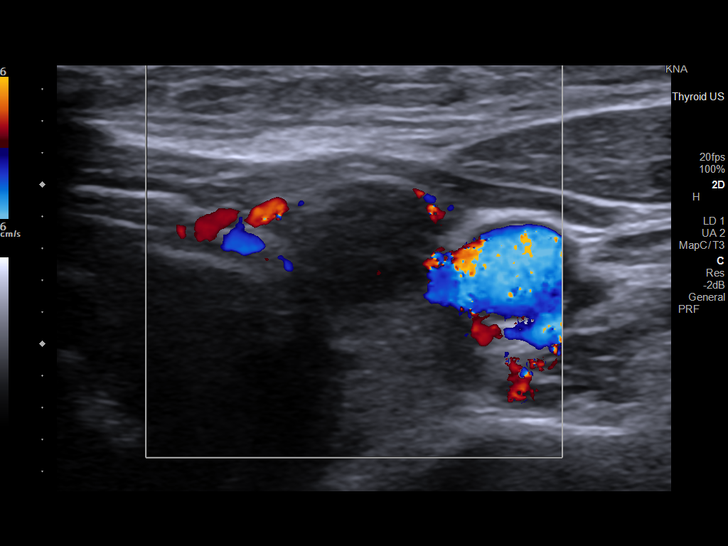

[13 of 19 positions shown; findings below may reference images not displayed]

Pre-procedural ultrasound scanning demonstrated unchanged size and
appearance of the indeterminate nodule within the left thyroid

The procedure was planned. The neck was prepped in the usual sterile
fashion, and a sterile drape was applied covering the operative
field. A timeout was performed prior to the initiation of the
procedure. Local anesthesia was provided with 1% lidocaine.

Under direct ultrasound guidance, 5 FNA biopsies were performed of
the left superior thyroid nodule with a 25 gauge needle.

2 of these samples were obtained for AFIRMA.

Multiple ultrasound images were saved for procedural documentation
purposes. The samples were prepared and submitted to pathology.

Limited post procedural scanning was negative for hematoma or
additional complication. Dressings were placed. The patient
tolerated the above procedures procedure well without immediate
postprocedural complication.
FINDINGS: Nodule reference number based on prior diagnostic ultrasound: 3

Maximum size: 1.5 cm

Location: Left; Superior

ACR TI-RADS risk category: TR4 (4-6 points)

Reason for biopsy: meets ACR TI-RADS criteria

Ultrasound imaging confirms appropriate placement of the needles
within the thyroid nodule.
IMPRESSION: Technically successful ultrasound guided fine needle aspiration of
left superior thyroid nodule

Read by

Soi Cheong Haylie

## 2022-07-04 IMAGING — CR DG CHEST 2V
2 series · 2 of 2 positions shown · non-contrast
Comparison: None.

CLINICAL DATA: Preoperative exam

EXAM:
CHEST - 2 VIEW

[w chest pa]
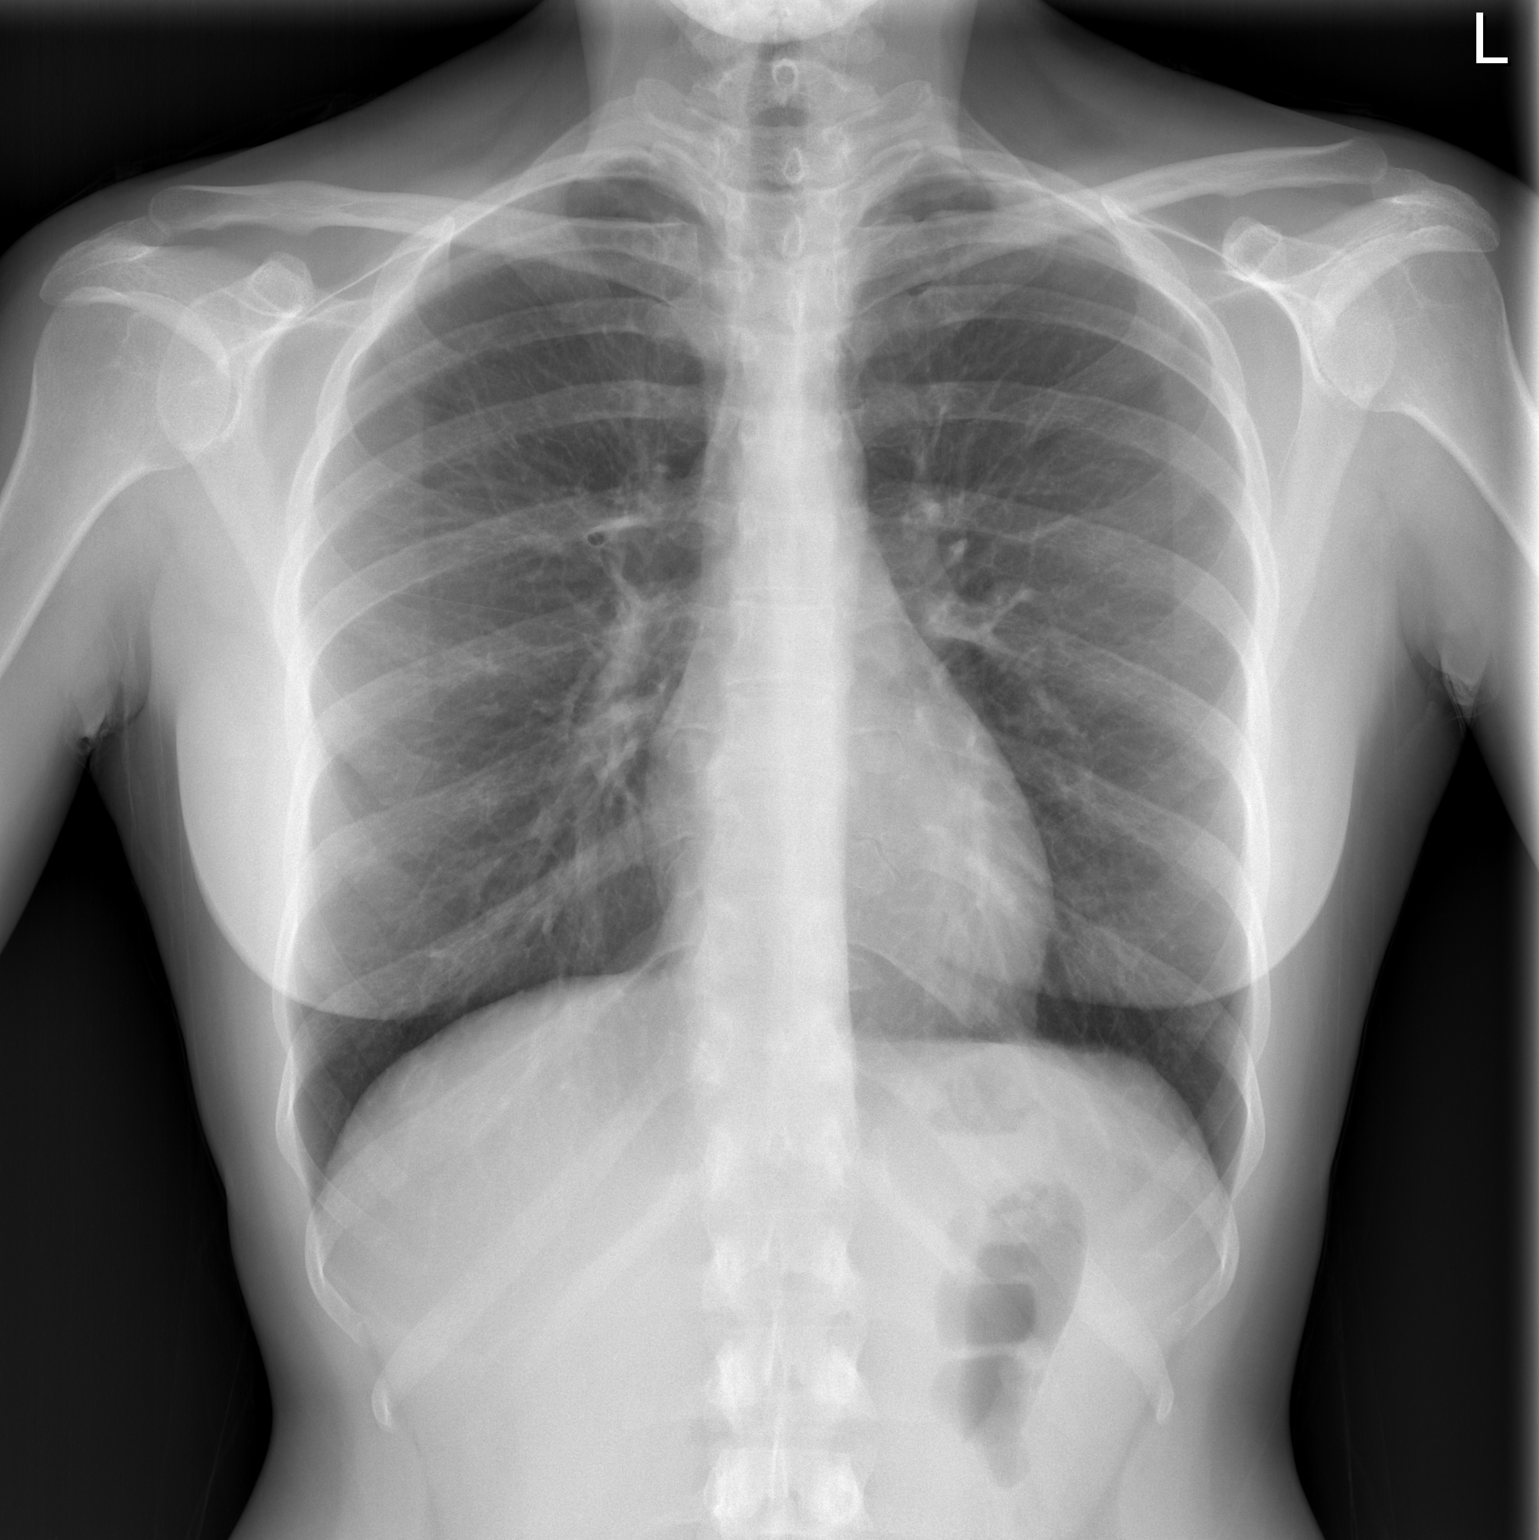

[w chest lat]
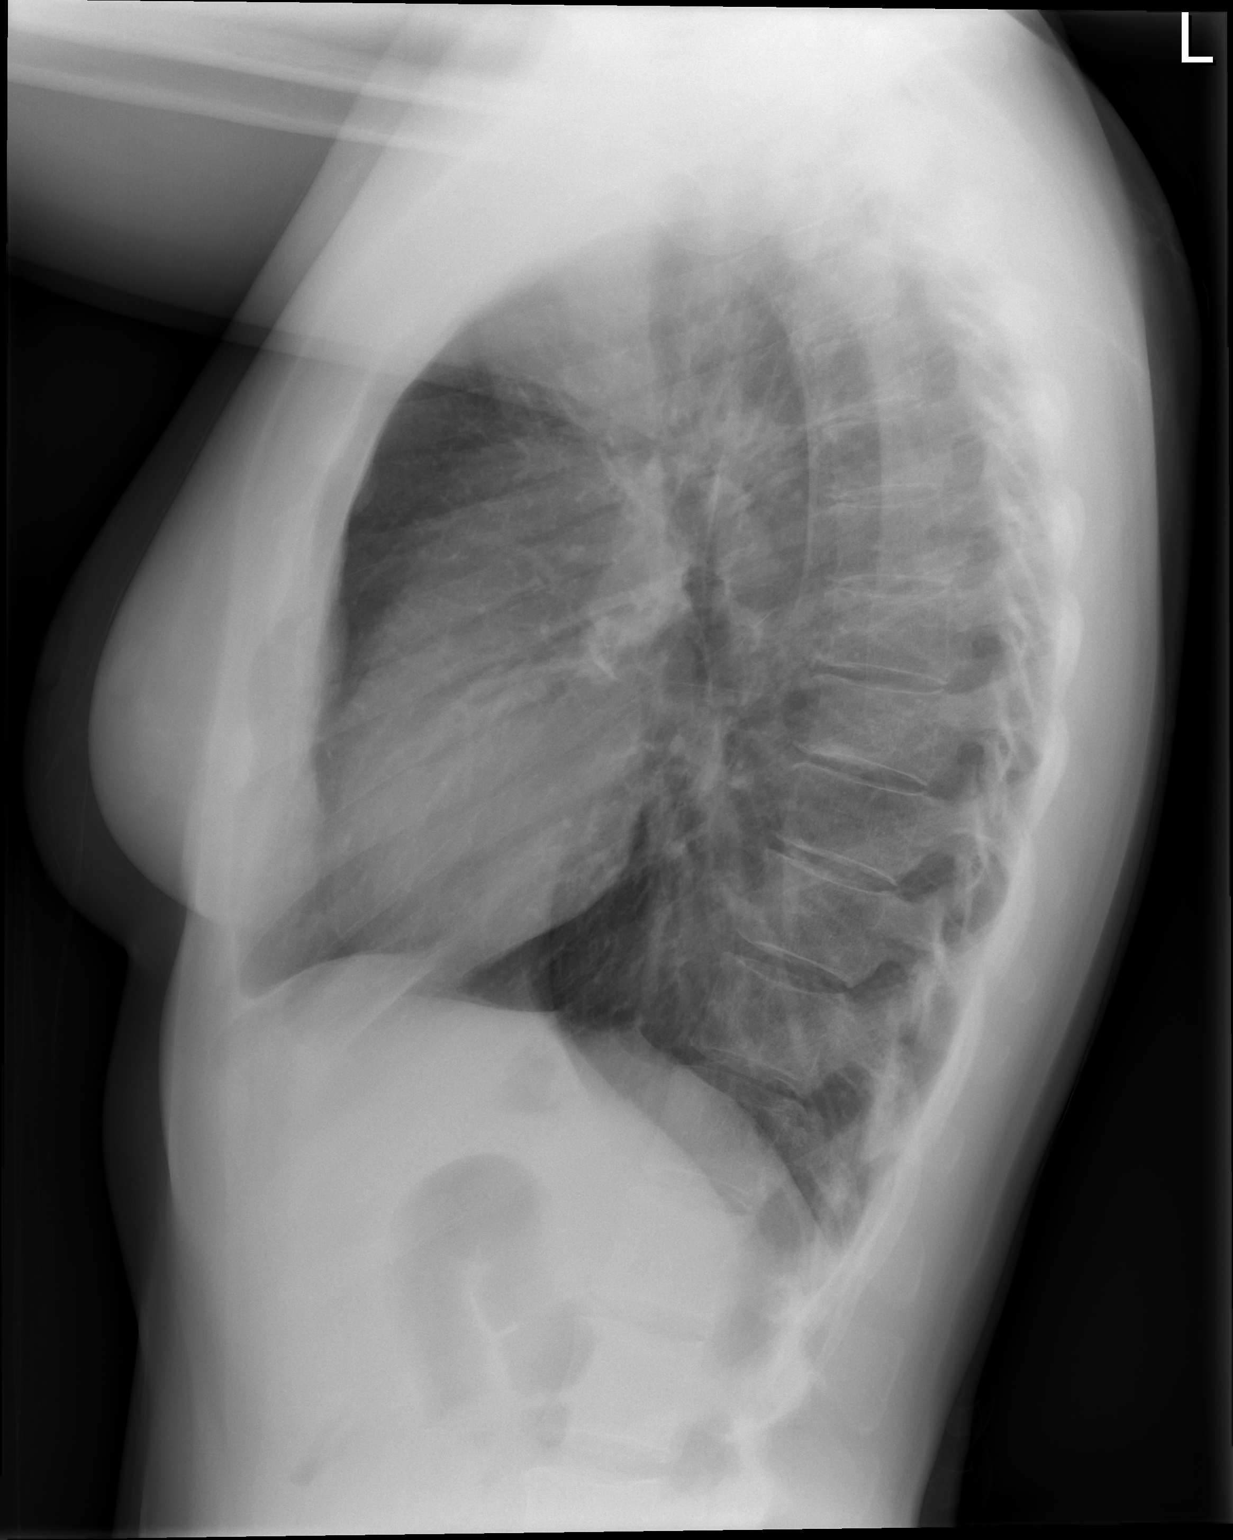

[2 of 2 positions shown; findings below may reference images not displayed]

FINDINGS: The heart size and mediastinal contours are within normal limits.
Both lungs are clear. The visualized skeletal structures are
unremarkable.
IMPRESSION: No active cardiopulmonary disease.

## 2022-09-10 ENCOUNTER — Ambulatory Visit: Payer: Medicaid Other | Admitting: "Endocrinology
